# Patient Record
Sex: Female | Born: 1960 | Race: White | Hispanic: No
Health system: Southern US, Community
[De-identification: ages and names within clinical notes are randomized; demographics above are authoritative.]

## PROBLEM LIST (undated history)

## (undated) DIAGNOSIS — E119 Type 2 diabetes mellitus without complications: Secondary | ICD-10-CM

## (undated) DIAGNOSIS — K7460 Unspecified cirrhosis of liver: Secondary | ICD-10-CM

---

## 2017-11-29 ENCOUNTER — Inpatient Hospital Stay (HOSPITAL_COMMUNITY): Payer: Medicare Other

## 2017-11-29 ENCOUNTER — Encounter (HOSPITAL_COMMUNITY): Payer: Self-pay | Admitting: Nurse Practitioner

## 2017-11-29 ENCOUNTER — Inpatient Hospital Stay (HOSPITAL_COMMUNITY)
Admission: AD | Admit: 2017-11-29 | Discharge: 2017-12-03 | DRG: 871 | Disposition: A | Discharge status: Home / Self Care | Payer: Medicare Other | Source: Other Acute Inpatient Hospital | Attending: Pulmonary Disease | Admitting: Pulmonary Disease

## 2017-11-29 DIAGNOSIS — Z7189 Other specified counseling: Secondary | ICD-10-CM | POA: Diagnosis not present

## 2017-11-29 DIAGNOSIS — J9601 Acute respiratory failure with hypoxia: Secondary | ICD-10-CM | POA: Diagnosis present

## 2017-11-29 DIAGNOSIS — N186 End stage renal disease: Secondary | ICD-10-CM | POA: Diagnosis present

## 2017-11-29 DIAGNOSIS — K7200 Acute and subacute hepatic failure without coma: Secondary | ICD-10-CM | POA: Diagnosis present

## 2017-11-29 DIAGNOSIS — Z7401 Bed confinement status: Secondary | ICD-10-CM

## 2017-11-29 DIAGNOSIS — J9690 Respiratory failure, unspecified, unspecified whether with hypoxia or hypercapnia: Secondary | ICD-10-CM | POA: Diagnosis not present

## 2017-11-29 DIAGNOSIS — I4891 Unspecified atrial fibrillation: Secondary | ICD-10-CM | POA: Diagnosis present

## 2017-11-29 DIAGNOSIS — K7581 Nonalcoholic steatohepatitis (NASH): Secondary | ICD-10-CM | POA: Diagnosis present

## 2017-11-29 DIAGNOSIS — G9341 Metabolic encephalopathy: Secondary | ICD-10-CM | POA: Diagnosis present

## 2017-11-29 DIAGNOSIS — E872 Acidosis: Secondary | ICD-10-CM | POA: Diagnosis present

## 2017-11-29 DIAGNOSIS — J189 Pneumonia, unspecified organism: Secondary | ICD-10-CM | POA: Diagnosis present

## 2017-11-29 DIAGNOSIS — L89150 Pressure ulcer of sacral region, unstageable: Secondary | ICD-10-CM | POA: Diagnosis present

## 2017-11-29 DIAGNOSIS — M869 Osteomyelitis, unspecified: Secondary | ICD-10-CM

## 2017-11-29 DIAGNOSIS — L89610 Pressure ulcer of right heel, unstageable: Secondary | ICD-10-CM | POA: Diagnosis present

## 2017-11-29 DIAGNOSIS — E1122 Type 2 diabetes mellitus with diabetic chronic kidney disease: Secondary | ICD-10-CM | POA: Diagnosis present

## 2017-11-29 DIAGNOSIS — R6521 Severe sepsis with septic shock: Secondary | ICD-10-CM | POA: Diagnosis present

## 2017-11-29 DIAGNOSIS — N390 Urinary tract infection, site not specified: Secondary | ICD-10-CM | POA: Diagnosis present

## 2017-11-29 DIAGNOSIS — N179 Acute kidney failure, unspecified: Secondary | ICD-10-CM

## 2017-11-29 DIAGNOSIS — K7290 Hepatic failure, unspecified without coma: Secondary | ICD-10-CM

## 2017-11-29 DIAGNOSIS — Z66 Do not resuscitate: Secondary | ICD-10-CM | POA: Diagnosis present

## 2017-11-29 DIAGNOSIS — A419 Sepsis, unspecified organism: Secondary | ICD-10-CM | POA: Diagnosis present

## 2017-11-29 DIAGNOSIS — R140 Abdominal distension (gaseous): Secondary | ICD-10-CM

## 2017-11-29 DIAGNOSIS — E1169 Type 2 diabetes mellitus with other specified complication: Secondary | ICD-10-CM | POA: Diagnosis present

## 2017-11-29 DIAGNOSIS — Z515 Encounter for palliative care: Secondary | ICD-10-CM | POA: Diagnosis present

## 2017-11-29 DIAGNOSIS — K7460 Unspecified cirrhosis of liver: Secondary | ICD-10-CM | POA: Diagnosis present

## 2017-11-29 DIAGNOSIS — M4628 Osteomyelitis of vertebra, sacral and sacrococcygeal region: Secondary | ICD-10-CM | POA: Diagnosis present

## 2017-11-29 DIAGNOSIS — Z9911 Dependence on respirator [ventilator] status: Secondary | ICD-10-CM

## 2017-11-29 DIAGNOSIS — J90 Pleural effusion, not elsewhere classified: Secondary | ICD-10-CM | POA: Diagnosis present

## 2017-11-29 DIAGNOSIS — L89210 Pressure ulcer of right hip, unstageable: Secondary | ICD-10-CM | POA: Diagnosis present

## 2017-11-29 DIAGNOSIS — E119 Type 2 diabetes mellitus without complications: Secondary | ICD-10-CM

## 2017-11-29 DIAGNOSIS — L8990 Pressure ulcer of unspecified site, unspecified stage: Secondary | ICD-10-CM

## 2017-11-29 HISTORY — DX: Type 2 diabetes mellitus without complications: E11.9

## 2017-11-29 HISTORY — DX: Unspecified cirrhosis of liver: K74.60

## 2017-11-29 LAB — PROTIME-INR
INR: 2.14
Prothrombin Time: 23.8 seconds — ABNORMAL HIGH (ref 11.4–15.2)

## 2017-11-29 LAB — COMPREHENSIVE METABOLIC PANEL
ALT: 11 U/L — ABNORMAL LOW (ref 14–54)
AST: 35 U/L (ref 15–41)
Albumin: 1.8 g/dL — ABNORMAL LOW (ref 3.5–5.0)
Alkaline Phosphatase: 103 U/L (ref 38–126)
Anion gap: 15 (ref 5–15)
BUN: 96 mg/dL — ABNORMAL HIGH (ref 6–20)
CHLORIDE: 101 mmol/L (ref 101–111)
CO2: 20 mmol/L — ABNORMAL LOW (ref 22–32)
CREATININE: 5.79 mg/dL — AB (ref 0.44–1.00)
Calcium: 8.1 mg/dL — ABNORMAL LOW (ref 8.9–10.3)
GFR, EST AFRICAN AMERICAN: 9 mL/min — AB (ref >60)
GFR, EST NON AFRICAN AMERICAN: 7 mL/min — AB (ref >60)
Glucose, Bld: 146 mg/dL — ABNORMAL HIGH (ref 65–99)
POTASSIUM: 3.7 mmol/L (ref 3.5–5.1)
Sodium: 136 mmol/L (ref 135–145)
Total Bilirubin: 3.5 mg/dL — ABNORMAL HIGH (ref 0.3–1.2)
Total Protein: 6.5 g/dL (ref 6.5–8.1)

## 2017-11-29 LAB — GLUCOSE, CAPILLARY: GLUCOSE-CAPILLARY: 124 mg/dL — AB (ref 65–99)

## 2017-11-29 LAB — URINALYSIS, ROUTINE W REFLEX MICROSCOPIC
Bilirubin Urine: NEGATIVE
GLUCOSE, UA: NEGATIVE mg/dL
Ketones, ur: NEGATIVE mg/dL
NITRITE: NEGATIVE
PROTEIN: NEGATIVE mg/dL
Specific Gravity, Urine: 1.01 (ref 1.005–1.030)
pH: 5 (ref 5.0–8.0)

## 2017-11-29 LAB — LACTIC ACID, PLASMA: LACTIC ACID, VENOUS: 3.5 mmol/L — AB (ref 0.5–1.9)

## 2017-11-29 LAB — CBC
HCT: 30.5 % — ABNORMAL LOW (ref 36.0–46.0)
Hemoglobin: 9.9 g/dL — ABNORMAL LOW (ref 12.0–15.0)
MCH: 32.6 pg (ref 26.0–34.0)
MCHC: 32.5 g/dL (ref 30.0–36.0)
MCV: 100.3 fL — ABNORMAL HIGH (ref 78.0–100.0)
Platelets: 221 10*3/uL (ref 150–400)
RBC: 3.04 MIL/uL — ABNORMAL LOW (ref 3.87–5.11)
RDW: 17.5 % — AB (ref 11.5–15.5)
WBC: 25.6 10*3/uL — ABNORMAL HIGH (ref 4.0–10.5)

## 2017-11-29 LAB — BLOOD GAS, ARTERIAL
Acid-base deficit: 3.2 mmol/L — ABNORMAL HIGH (ref 0.0–2.0)
BICARBONATE: 20.2 mmol/L (ref 20.0–28.0)
Drawn by: 414221
FIO2: 40
LHR: 20 {breaths}/min
O2 SAT: 98.2 %
PATIENT TEMPERATURE: 98.6
PCO2 ART: 29.9 mmHg — AB (ref 32.0–48.0)
PEEP: 5 cmH2O
VT: 400 mL
pH, Arterial: 7.444 (ref 7.350–7.450)
pO2, Arterial: 108 mmHg (ref 83.0–108.0)

## 2017-11-29 LAB — CK: Total CK: 110 U/L (ref 38–234)

## 2017-11-29 LAB — MAGNESIUM: MAGNESIUM: 1.7 mg/dL (ref 1.7–2.4)

## 2017-11-29 LAB — AMMONIA: Ammonia: 73 umol/L — ABNORMAL HIGH (ref 9–35)

## 2017-11-29 LAB — PHOSPHORUS: PHOSPHORUS: 6.8 mg/dL — AB (ref 2.5–4.6)

## 2017-11-29 LAB — TROPONIN I: TROPONIN I: 0.11 ng/mL — AB (ref <0.03)

## 2017-11-29 LAB — MRSA PCR SCREENING: MRSA by PCR: POSITIVE — AB

## 2017-11-29 LAB — PROCALCITONIN: PROCALCITONIN: 1.05 ng/mL

## 2017-11-29 LAB — CORTISOL: Cortisol, Plasma: 22.5 ug/dL

## 2017-11-29 LAB — APTT: APTT: 36 s (ref 24–36)

## 2017-11-29 LAB — VANCOMYCIN, RANDOM: Vancomycin Rm: 15

## 2017-11-29 MED ORDER — HEPARIN SODIUM (PORCINE) 5000 UNIT/ML IJ SOLN
5000.0000 [IU] | Freq: Three times a day (TID) | INTRAMUSCULAR | Status: DC
  Administered 2017-11-29 – 2017-12-03 (×11): 5000 [IU] via SUBCUTANEOUS
  Filled 2017-11-29 (×11): qty 1

## 2017-11-29 MED ORDER — INSULIN ASPART 100 UNIT/ML ~~LOC~~ SOLN
0.0000 [IU] | SUBCUTANEOUS | Status: DC
  Administered 2017-11-29: 2 [IU] via SUBCUTANEOUS
  Administered 2017-11-30: 3 [IU] via SUBCUTANEOUS
  Administered 2017-11-30 (×5): 2 [IU] via SUBCUTANEOUS
  Administered 2017-12-01: 3 [IU] via SUBCUTANEOUS
  Administered 2017-12-01: 2 [IU] via SUBCUTANEOUS
  Administered 2017-12-01: 3 [IU] via SUBCUTANEOUS
  Administered 2017-12-02 (×2): 2 [IU] via SUBCUTANEOUS
  Administered 2017-12-02: 3 [IU] via SUBCUTANEOUS
  Administered 2017-12-02: 2 [IU] via SUBCUTANEOUS

## 2017-11-29 MED ORDER — SODIUM CHLORIDE 0.9 % IV SOLN: 250.0000 mL | INTRAVENOUS | Status: DC | PRN

## 2017-11-29 MED ORDER — LACTULOSE 10 GM/15ML PO SOLN
30.0000 g | Freq: Three times a day (TID) | ORAL | Status: DC
  Administered 2017-11-29 – 2017-12-01 (×4): 30 g via ORAL
  Filled 2017-11-29 (×6): qty 45

## 2017-11-29 MED ORDER — DOCUSATE SODIUM 50 MG/5ML PO LIQD
100.0000 mg | Freq: Two times a day (BID) | ORAL | Status: DC
  Administered 2017-11-29 – 2017-12-02 (×6): 100 mg
  Filled 2017-11-29 (×7): qty 10

## 2017-11-29 MED ORDER — VANCOMYCIN HCL IN DEXTROSE 1-5 GM/200ML-% IV SOLN
1000.0000 mg | INTRAVENOUS | Status: DC
Start: 2017-11-29 — End: 2017-12-03
  Administered 2017-11-29 – 2017-12-01 (×2): 1000 mg via INTRAVENOUS
  Filled 2017-11-29 (×2): qty 200

## 2017-11-29 MED ORDER — MIDAZOLAM HCL 2 MG/2ML IJ SOLN: 2.0000 mg | INTRAMUSCULAR | Status: DC | PRN

## 2017-11-29 MED ORDER — SODIUM CHLORIDE 0.9 % IV SOLN
INTRAVENOUS | Status: DC
  Administered 2017-11-29 – 2017-12-03 (×6): via INTRAVENOUS

## 2017-11-29 MED ORDER — PANTOPRAZOLE SODIUM 40 MG PO PACK
40.0000 mg | PACK | Freq: Every day | ORAL | Status: DC
  Administered 2017-11-30 – 2017-12-02 (×3): 40 mg
  Filled 2017-11-29 (×4): qty 20

## 2017-11-29 MED ORDER — MAGNESIUM SULFATE 2 GM/50ML IV SOLN
2.0000 g | Freq: Once | INTRAVENOUS | Status: AC
  Administered 2017-11-29: 2 g via INTRAVENOUS
  Filled 2017-11-29: qty 50

## 2017-11-29 MED ORDER — SODIUM CHLORIDE 0.9 % IV BOLUS
1000.0000 mL | Freq: Once | INTRAVENOUS | Status: AC
  Administered 2017-11-29: 1000 mL via INTRAVENOUS

## 2017-11-29 MED ORDER — NOREPINEPHRINE 4 MG/250ML-% IV SOLN
0.0000 ug/min | INTRAVENOUS | Status: DC
  Administered 2017-11-29: 24 ug/min via INTRAVENOUS
  Administered 2017-11-30: 22 ug/min via INTRAVENOUS
  Administered 2017-11-30: 24 ug/min via INTRAVENOUS
  Administered 2017-11-30: 21 ug/min via INTRAVENOUS
  Administered 2017-11-30: 16 ug/min via INTRAVENOUS
  Administered 2017-11-30: 15 ug/min via INTRAVENOUS
  Administered 2017-12-01: 14 ug/min via INTRAVENOUS
  Administered 2017-12-01: 15 ug/min via INTRAVENOUS
  Administered 2017-12-01: 14 ug/min via INTRAVENOUS
  Administered 2017-12-01 – 2017-12-03 (×9): 15 ug/min via INTRAVENOUS
  Filled 2017-11-29 (×22): qty 250

## 2017-11-29 MED ORDER — MIDAZOLAM HCL 2 MG/2ML IJ SOLN
2.0000 mg | INTRAMUSCULAR | Status: DC | PRN
  Administered 2017-11-29 – 2017-11-30 (×2): 2 mg via INTRAVENOUS
  Filled 2017-11-29 (×2): qty 2

## 2017-11-29 MED ORDER — FENTANYL CITRATE (PF) 100 MCG/2ML IJ SOLN
100.0000 ug | INTRAMUSCULAR | Status: AC | PRN
  Administered 2017-11-29 – 2017-11-30 (×3): 100 ug via INTRAVENOUS
  Filled 2017-11-29 (×3): qty 2

## 2017-11-29 MED ORDER — FENTANYL CITRATE (PF) 100 MCG/2ML IJ SOLN
100.0000 ug | INTRAMUSCULAR | Status: DC | PRN
  Administered 2017-11-29 – 2017-11-30 (×3): 100 ug via INTRAVENOUS
  Filled 2017-11-29 (×3): qty 2

## 2017-11-29 MED ORDER — SODIUM CHLORIDE 0.9 % IV SOLN
500.0000 mg | Freq: Two times a day (BID) | INTRAVENOUS | Status: DC
  Administered 2017-11-30 – 2017-12-02 (×7): 500 mg via INTRAVENOUS
  Filled 2017-11-29 (×8): qty 0.5

## 2017-11-29 NOTE — Progress Notes (Signed)
CRITICAL VALUE ALERT  Critical Value:  Troponin 0.11, lactic acid 3.5  Date & Time Notied:  11/29/2017 2150  Provider Notified: Idolina PrimerK Eubanks NP  Orders Received/Actions taken: no new orders at this time

## 2017-11-29 NOTE — Progress Notes (Addendum)
Pharmacy Antibiotic Note  Anna Miranda is a 57 y.o. female admitted transferred from OSH on 11/29/2017 with sepsis.  At [facility] hospital ED: CT Chest concerning for necrotic lung from prior infection. U/A with bacteria and Leukocytes. Multiple wounds that are foul smelling and maggots were noted  - Pharmacy has been consulted for Vancomycin and Meropenem dosing.  Vanc/meropenem D#1 for sepsis - WBC 28.3, SCr 5.91 per OSH (unknown baseline) - received vanc 1g x 1 6/15 @1600  + meropenem 500mg  Q12H at 1300 today  PMH of End Stage Liver Disease, DM, Recurrent Right Side Pleural Effusion   Plan: Vancomycin random level now, then access when next dose of vancomycin needed. F/u  SCr ,then order maintenance Meropenem and vancomycin dose.  Follow up clinical status, renal function, culture results, and check vancomycin level per protocol.  Height: 5\' 9"  (175.3 cm) Weight: 189 lb 2.5 oz (85.8 kg) IBW/kg (Calculated) : 66.2  No data recorded.  No results for input(s): WBC, CREATININE, LATICACIDVEN, VANCOTROUGH, VANCOPEAK, VANCORANDOM, GENTTROUGH, GENTPEAK, GENTRANDOM, TOBRATROUGH, TOBRAPEAK, TOBRARND, AMIKACINPEAK, AMIKACINTROU, AMIKACIN in the last 168 hours.  CrCl cannot be calculated (No order found.).    Allergies not on file  Antimicrobials this admission: Vanc 6/15>> Meropenem 6/16>> Zosyn x 1 6/15 Azithro x 1 6/15   Dose adjustments this admission:   Microbiology results: 6/16 Resp cx: sent 6/16 Bld x2: sent  6/16 MRSA PCR sent  Thank you for allowing pharmacy to be a part of this patient's care.  Anna Miranda, RPh Clinical Pharmacist 7:30-3:30pm: [phone] 3:30-10:00pm [phone] Main Rx: [id number] 11/29/2017 8:50 PM   ADDENDUM:  Vancomycin random - 15 mcg/ml  ( after 1 g IV x1 given 6/15 @ 16:00 at OSH) SCr 5.79, crcl ~ 12 ml/min WBC 25.6k , LA 3.5, PCT 1.05 Wt 85.8 kg  Plan:  -Meropenem 500mg  q12h  Vancomycin 1000mg  IV q48h  Anna Miranda, RPh Clinical  Pharmacist [phone] Main Pharmacy 11/29/2017 10:18 PM

## 2017-11-29 NOTE — Progress Notes (Signed)
PCCM Interval Note   Spoke to son Iantha FallenKenneth. States that mother was completely unresponsive 6/15 when he arrived to her house so he called EMS. When he arrived to the hospital patient was intubated. He states that patient had a DNR and never wanted to be intubated, however since she was already intubated he wanted to continue treatment at this time.   6-8 months ago patient had a fall resulting in a leg fracture, since her mobility and health have declined. She is basically bed bound at this point however is resistant to help with ADLS. One month ago patient was at [facility] with recurrent pleural effusion secondary to liver cirrhosis and required a chest tube.  His goal is to give her 48-72 to see if she improves, during this time if she declines he understands that her wishes would be not to undergo aggressive care.   Jovita KussmaulKatalina Eubanks, AGACNP-BC [facility]  Pgr: [phone]  PCCM Pgr: [phone]

## 2017-11-29 NOTE — H&P (Addendum)
PULMONARY / CRITICAL CARE MEDICINE   Name: Anna Miranda MRN: [id number] DOB: [birth year]    ADMISSION DATE:  11/29/2017 CONSULTATION DATE:  11/29/2017  REFERRING MD:  [facility]   CHIEF COMPLAINT:  Sepsis   HISTORY OF PRESENT ILLNESS:   57 year old female with PMH of End Stage Liver Disease, DM, Recurrent Right Side Pleural Effusion  Presents to [place] on 6/15 with reported 2 weeks of poor appetite and generalized weakness. Family reports that patient has been resistant to care and unwilling to go to the hospital. When she arrived to ED her BP was 60/40. Received Fluid, Vancomycin, Meropenem. CT Chest concerning for necrotic lung from prior infection. U/A with bacteria and Leukocytes. Multiple wounds that are foul smelling and maggots were noted. Required intubation for respiratory distress and hypoxia,however documented in discharge note from [facility] that patient and family have previously expressed wishes for no intubation. On 6/16 presents to [facility] on Levophed.  Recent admission one month ago to [place], no much information available, documented however that she had a chest tube placed for recurrent pleural effusion.     Documented DNR, in which family states patient signed 2 years ago and would like to uphold.   PAST MEDICAL HISTORY :  She  has a past medical history of Cirrhosis (HCC) and Diabetes mellitus without complication (HCC).  PAST SURGICAL HISTORY: She  has no past surgical history on file.  Allergies not on file  No current facility-administered medications on file prior to encounter.    No current outpatient medications on file prior to encounter.    FAMILY HISTORY:  Her has no family status information on file.    SOCIAL HISTORY: She    REVIEW OF SYSTEMS:   Unable to review as patient is intubated   SUBJECTIVE:    VITAL SIGNS: BP (!) 114/99   Pulse (!) 104   Resp (!) 21   Ht 5\' 9"  (1.753 m)   Wt 85.8 kg (189 lb 2.5 oz)   SpO2 100%    BMI 27.93 kg/m   HEMODYNAMICS:    VENTILATOR SETTINGS:    INTAKE / OUTPUT: No intake/output data recorded.  PHYSICAL EXAMINATION: General:  Adult female, no distress  Neuro:  Lethargic, opens eyes to verbal stimulation, nods head, does not move extremities, pupils intact   HEENT:  Dry MM Cardiovascular:  Tachy, no MRG  Lungs:  Clear breath sounds  Abdomen:  Non-distended, soft  Musculoskeletal: chronic vascular insuffiencey  Skin: multiple pressures ulcers as described below   LABS:  BMET No results for input(s): NA, K, CL, CO2, BUN, CREATININE, GLUCOSE in the last 168 hours.  Electrolytes No results for input(s): CALCIUM, MG, PHOS in the last 168 hours.  CBC Recent Labs  Lab 11/29/17 2030  WBC 25.6*  HGB 9.9*  HCT 30.5*  PLT 221    Coag's Recent Labs  Lab 11/29/17 2030  APTT 36  INR 2.14    Sepsis Markers No results for input(s): LATICACIDVEN, PROCALCITON, O2SATVEN in the last 168 hours.  ABG Recent Labs  Lab 11/29/17 2010  PHART 7.444  PCO2ART 29.9*  PO2ART 108    Liver Enzymes No results for input(s): AST, ALT, ALKPHOS, BILITOT, ALBUMIN in the last 168 hours.  Cardiac Enzymes No results for input(s): TROPONINI, PROBNP in the last 168 hours.  Glucose Recent Labs  Lab 11/29/17 2000  GLUCAP 124*    Imaging Dg Chest Port 1 View  Result Date: 11/29/2017 CLINICAL DATA:  Vent dependent; verify  ETT and OG if possible EXAM: PORTABLE CHEST 1 VIEW COMPARISON:  None. FINDINGS: Endotracheal tube appears well positioned with tip approximately 4 cm above the carina. Enteric tube passes below the diaphragm. LEFT IJ central line appears adequately positioned with tip at the level of the upper SVC. Probable atelectasis and/or small pleural effusion at the RIGHT lung base. No pneumothorax seen. Heart size is upper normal. IMPRESSION: 1. Endotracheal tube appears well positioned with tip approximately 4 cm above the carina. 2. LEFT IJ central line in place  with tip at the level of the upper SVC. 3. Enteric tube passes below the diaphragm. 4. Probable atelectasis and/or small pleural effusion at the RIGHT lung base. Additional chronic/postsurgical changes at the RIGHT lung base? Electronically Signed   By: Bary RichardStan  Maynard M.D.   On: 11/29/2017 19:58     STUDIES:  CT C/A/P 6/15 > Appearance of the right hemithorax which favors sequelae of prior infection, with chronic pleural fluid and right lung base volume loss/rounded atelectasis. Areas of other right-sided opacification and anterior lung destruction. Fluid secretions in the Endobronchial tree, nonspecific left sided thyroid nodule   CULTURES: Blood 6/16 >> Sputum 6/16 >> U/A 6/16 >>  ANTIBIOTICS: Vancomycin 6/15 >> Meropenem 6/16 >>  SIGNIFICANT EVENTS: 6/15 > Presents to ED  6/16 > Transferred to [facility]   LINES/TUBES: ETT 6/15 >>> Left IJ CVC 6/15 >>   DISCUSSION: 57 year old female with PMH of Liver Cirrhosis presents to [facility] ED with septic shock on 6/15. Transferred to [facility] on 6/16 for further care.    ASSESSMENT / PLAN:  PULMONARY A: Acute Hypoxic Respiratory Failure with  Recurrent Right Side Pleural Effusion P:   Vent Support Trend ABG/CXR VAP Bundle  Pulmonary Hygiene   CARDIOVASCULAR A:  Hypotension in setting of Septic Shock  H/O A.Fib  P:  Cardiac Monitoring  Wean Levophed to Maintain MAP >65  ECHO pending  Trend CVP   RENAL A:   Acute Kidney Injury  (Unsure Baseline Function) > Currently Crt 5.91 Anion Gap Metabolic Acidosis with Lactic Acidosis  P:   Trend BMP Replace electrolytes as indicated  Trend LA  Trend CK  Renal U/S pending   GASTROINTESTINAL A:   End-Stage Liver Cirrhosis   -Ammonia 115  P:   NPO PPI Trend LFT Lactulose 30 mg TID   HEMATOLOGIC A:   Concern for affected coagulation given Liver Cirrhosis  P:  Trend CBC INR/PT pending  Heparin SQ for VTE   INFECTIOUS A:   Septic Shock in setting of Osteo vs  Urosepsis -WBC 25.6 >>  Sacral wound with exudate Right Great Toe with redness and eschar  Right Heel Stage 2  P:   Trend WBC and Fever Curve Trend PCT and LA  PAN Culture Continue Vancomycin and Meropenem  Xray of Right Foot pending given concern for Osteo   ENDOCRINE A:   DM   P:   Trend Glucose SSI   NEUROLOGIC A:   Hepatic vs Metabolic Encephalopathy   -Ammonia 115  P:   RASS goal: 0/-1 Fentanyl and Versed PRN to achieve RASS  Ammonia Pending    FAMILY  - Updates: Son updated via phone, states that mother is a known DNR and has expressed these wishes for the last 2 years. Currently would like to gather more information as far as prognosis and diagnosis before he makes further decisions   - Inter-disciplinary family meet or Palliative Care meeting due by:  12/06/2017  CC  Time: 62 minutes   Jovita Kussmaul, AGACNP-BC [facility]  Pgr: [phone]  PCCM Pgr: [phone]

## 2017-11-30 ENCOUNTER — Inpatient Hospital Stay (HOSPITAL_COMMUNITY): Payer: Medicare Other

## 2017-11-30 DIAGNOSIS — K7460 Unspecified cirrhosis of liver: Secondary | ICD-10-CM

## 2017-11-30 DIAGNOSIS — J9601 Acute respiratory failure with hypoxia: Secondary | ICD-10-CM

## 2017-11-30 DIAGNOSIS — L8990 Pressure ulcer of unspecified site, unspecified stage: Secondary | ICD-10-CM

## 2017-11-30 LAB — GLUCOSE, CAPILLARY
GLUCOSE-CAPILLARY: 121 mg/dL — AB (ref 65–99)
GLUCOSE-CAPILLARY: 133 mg/dL — AB (ref 65–99)
GLUCOSE-CAPILLARY: 140 mg/dL — AB (ref 65–99)
Glucose-Capillary: 153 mg/dL — ABNORMAL HIGH (ref 65–99)
Glucose-Capillary: 150 mg/dL — ABNORMAL HIGH (ref 65–99)
Glucose-Capillary: 129 mg/dL — ABNORMAL HIGH (ref 65–99)
Glucose-Capillary: 111 mg/dL — ABNORMAL HIGH (ref 65–99)

## 2017-11-30 LAB — CBC
HCT: 29.8 % — ABNORMAL LOW (ref 36.0–46.0)
Hemoglobin: 9.7 g/dL — ABNORMAL LOW (ref 12.0–15.0)
MCH: 33.3 pg (ref 26.0–34.0)
MCHC: 32.6 g/dL (ref 30.0–36.0)
MCV: 102.4 fL — ABNORMAL HIGH (ref 78.0–100.0)
PLATELETS: 198 10*3/uL (ref 150–400)
RBC: 2.91 MIL/uL — AB (ref 3.87–5.11)
RDW: 17.3 % — ABNORMAL HIGH (ref 11.5–15.5)
WBC: 31.1 10*3/uL — ABNORMAL HIGH (ref 4.0–10.5)

## 2017-11-30 LAB — BLOOD GAS, ARTERIAL
ACID-BASE DEFICIT: 3.8 mmol/L — AB (ref 0.0–2.0)
Bicarbonate: 20.8 mmol/L (ref 20.0–28.0)
DRAWN BY: 10006
FIO2: 40
MECHVT: 400 mL
O2 Saturation: 98.8 %
PEEP/CPAP: 5 cmH2O
PO2 ART: 139 mmHg — AB (ref 83.0–108.0)
Patient temperature: 98.6
RATE: 20 resp/min
pCO2 arterial: 37.7 mmHg (ref 32.0–48.0)
pH, Arterial: 7.36 (ref 7.350–7.450)

## 2017-11-30 LAB — BASIC METABOLIC PANEL
Anion gap: 11 (ref 5–15)
BUN: 90 mg/dL — ABNORMAL HIGH (ref 6–20)
CALCIUM: 7.9 mg/dL — AB (ref 8.9–10.3)
CO2: 21 mmol/L — AB (ref 22–32)
CREATININE: 5.45 mg/dL — AB (ref 0.44–1.00)
Chloride: 104 mmol/L (ref 101–111)
GFR calc non Af Amer: 8 mL/min — ABNORMAL LOW (ref >60)
GFR, EST AFRICAN AMERICAN: 9 mL/min — AB (ref >60)
Glucose, Bld: 185 mg/dL — ABNORMAL HIGH (ref 65–99)
Potassium: 3.5 mmol/L (ref 3.5–5.1)
SODIUM: 136 mmol/L (ref 135–145)

## 2017-11-30 LAB — AMMONIA: Ammonia: 61 umol/L — ABNORMAL HIGH (ref 9–35)

## 2017-11-30 LAB — ECHOCARDIOGRAM COMPLETE
HEIGHTINCHES: 69 in
WEIGHTICAEL: 3026.47 [oz_av]

## 2017-11-30 LAB — PHOSPHORUS: Phosphorus: 6.2 mg/dL — ABNORMAL HIGH (ref 2.5–4.6)

## 2017-11-30 LAB — MAGNESIUM: MAGNESIUM: 2.1 mg/dL (ref 1.7–2.4)

## 2017-11-30 LAB — HIV ANTIBODY (ROUTINE TESTING W REFLEX): HIV Screen 4th Generation wRfx: NONREACTIVE

## 2017-11-30 LAB — LACTIC ACID, PLASMA
LACTIC ACID, VENOUS: 2.5 mmol/L — AB (ref 0.5–1.9)
LACTIC ACID, VENOUS: 2.3 mmol/L — AB (ref 0.5–1.9)
LACTIC ACID, VENOUS: 2.1 mmol/L — AB (ref 0.5–1.9)

## 2017-11-30 LAB — TROPONIN I
Troponin I: 0.06 ng/mL (ref <0.03)
Troponin I: 0.05 ng/mL (ref <0.03)
Troponin I: 0.04 ng/mL (ref <0.03)

## 2017-11-30 LAB — PROCALCITONIN: Procalcitonin: 1.31 ng/mL

## 2017-11-30 MED ORDER — ORAL CARE MOUTH RINSE
15.0000 mL | OROMUCOSAL | Status: DC
  Administered 2017-11-30 – 2017-12-03 (×30): 15 mL via OROMUCOSAL

## 2017-11-30 MED ORDER — SODIUM CHLORIDE 0.9% FLUSH: 10.0000 mL | INTRAVENOUS | Status: DC | PRN

## 2017-11-30 MED ORDER — CHLORHEXIDINE GLUCONATE CLOTH 2 % EX PADS
6.0000 | MEDICATED_PAD | Freq: Every day | CUTANEOUS | Status: DC
  Administered 2017-11-30 – 2017-12-02 (×3): 6 via TOPICAL

## 2017-11-30 MED ORDER — FENTANYL BOLUS VIA INFUSION
50.0000 ug | INTRAVENOUS | Status: DC | PRN
  Filled 2017-11-30: qty 50

## 2017-11-30 MED ORDER — SODIUM CHLORIDE 0.9 % IV BOLUS
1000.0000 mL | Freq: Once | INTRAVENOUS | Status: AC
  Administered 2017-11-30: 1000 mL via INTRAVENOUS

## 2017-11-30 MED ORDER — FENTANYL 2500MCG IN NS 250ML (10MCG/ML) PREMIX INFUSION
25.0000 ug/h | INTRAVENOUS | Status: DC
  Administered 2017-11-30: 50 ug/h via INTRAVENOUS
  Administered 2017-12-01 – 2017-12-02 (×2): 100 ug/h via INTRAVENOUS
  Administered 2017-12-03: 150 ug/h via INTRAVENOUS
  Filled 2017-11-30 (×4): qty 250

## 2017-11-30 MED ORDER — SODIUM CHLORIDE 0.9% FLUSH
10.0000 mL | Freq: Two times a day (BID) | INTRAVENOUS | Status: DC
  Administered 2017-11-30 (×2): 10 mL

## 2017-11-30 MED ORDER — MUPIROCIN 2 % EX OINT
1.0000 "application " | TOPICAL_OINTMENT | Freq: Two times a day (BID) | CUTANEOUS | Status: DC
  Administered 2017-11-30 – 2017-12-02 (×7): 1 via NASAL
  Filled 2017-11-30 (×2): qty 22

## 2017-11-30 MED ORDER — CHLORHEXIDINE GLUCONATE 0.12% ORAL RINSE (MEDLINE KIT)
15.0000 mL | Freq: Two times a day (BID) | OROMUCOSAL | Status: DC
  Administered 2017-11-30 – 2017-12-03 (×7): 15 mL via OROMUCOSAL

## 2017-11-30 MED ORDER — COLLAGENASE 250 UNIT/GM EX OINT
TOPICAL_OINTMENT | Freq: Every day | CUTANEOUS | Status: DC
  Administered 2017-12-01 – 2017-12-02 (×3): via TOPICAL
  Filled 2017-11-30 (×3): qty 30

## 2017-11-30 MED ORDER — VASOPRESSIN 20 UNIT/ML IV SOLN
0.0300 [IU]/min | INTRAVENOUS | Status: DC
  Administered 2017-11-30 – 2017-12-03 (×4): 0.03 [IU]/min via INTRAVENOUS
  Filled 2017-11-30 (×3): qty 2

## 2017-11-30 MED ORDER — ALBUMIN HUMAN 5 % IV SOLN
12.5000 g | Freq: Once | INTRAVENOUS | Status: AC
  Administered 2017-11-30: 12.5 g via INTRAVENOUS
  Filled 2017-11-30: qty 250

## 2017-11-30 MED ORDER — FENTANYL CITRATE (PF) 100 MCG/2ML IJ SOLN
50.0000 ug | Freq: Once | INTRAMUSCULAR | Status: AC
  Administered 2017-11-30: 50 ug via INTRAVENOUS
  Filled 2017-11-30: qty 2

## 2017-11-30 MED ORDER — CHLORHEXIDINE GLUCONATE CLOTH 2 % EX PADS: 6.0000 | MEDICATED_PAD | Freq: Every day | CUTANEOUS | Status: DC

## 2017-11-30 MED ORDER — ALBUMIN HUMAN 25 % IV SOLN
50.0000 g | Freq: Once | INTRAVENOUS | Status: AC
  Administered 2017-11-30: 50 g via INTRAVENOUS
  Filled 2017-11-30: qty 200
  Filled 2017-11-30: qty 250

## 2017-11-30 NOTE — Progress Notes (Signed)
Echocardiogram 2D Echocardiogram has been performed.  Pieter PartridgeBrooke S Bertil Brickey 11/30/2017, 8:38 AM

## 2017-11-30 NOTE — Progress Notes (Signed)
Patient's CVP running a little low at 5-7; will give 1L NS bolus.  Has has no ABG today; obtain one now.   Discussed with bedside RN

## 2017-11-30 NOTE — Consult Note (Signed)
Consultation Note Date: 11/30/2017   Patient Name: Anna Miranda  DOB: 05-Sep-1960  MRN: [id number]  Age / Sex: 57 y.o., female  PCP: No primary care provider on file. Referring Physician: Reyne Dumas, MD  Reason for Consultation: Establishing goals of care  HPI/Patient Profile: 57 y.o. female admitted on 11/29/2017    Clinical Assessment and Goals of Care:  57 year old lady with history of end-stage liver disease diabetes recurrent right-sided pleural effusion, presented to outside hospital with 2 weeks worth of poor appetite and generalized weakness. Noted to have low blood pressures noted to have CT chest concerning for necrotic lung from prior infection, noted to have possible urinary tract infection multiple foul-smelling wounds. Was intubated for respiratory distress at outside hospital and was transferred to [facility] on pressors.  Patient remains intubated mechanically ventilated on pressors and on fentanyl low-dose infusion here. Concern for septic shock due to urinary infection and wounds. To undergo MRI of the right hip for further workup of wounds. She has been seen by wound care. She is on broad-spectrum antibiotics.  Palliative consultation for goals of care discussions.  Patient is resting in bed. She does not appear to be in any acute distress. Discussed in detail with bedside RN, appreciate information and input. It is noted that the patient was very restless a few hours ago. She appears to be resting comfortably since initiation of low-dose fentanyl infusion. There is no family present at the bedside.  Call placed and discussed with son Chrissie Noa at [phone]. I introduce myself and palliative care as follows: Palliative medicine is specialized medical care for people living with serious illness. It focuses on providing relief from the symptoms and stress of a serious illness. The goal  is to improve quality of life for both the patient and the family.  Discussed patient's current conditions. Son had called the ICU a few hours earlier and got information about how his mother is doing. He asks, "is she getting any better?"  Discussed frankly and compassionately with the patient's son about the serious and extensive nature of the patient's illness. Her baseline is such that she was not able to walk much. She has been having generalized weakness for several months. Son states that she was living with her boyfriend and was not taking good care of herself. Subsequently, a few months ago, the son brought the patient to live with him in [place], [place].   Goals wishes and values discussed. Current care being provided in the ICU also discussed. Discussed frankly that the patient is requiring a lot of mechanical, external support with both ventilator and pressors at the moment.  See additional discussions/recommendations below. Thank you for the consult.  NEXT OF KIN  son Geraldin Habermehl [phone]. He states he is the patient's only child. He states the patient is not married.  Additional discussions/SUMMARY OF RECOMMENDATIONS    1. Agree with Partial code.   2. Continue current mode of care as a time-limited trial for the next 24-48 hours. Patient remains on  broad-spectrum antibiotics, pressors, intubation and mechanical ventilation.   All of this has been discussed with the son Oriah Leinweber over the phone at [phone].  Further imaging with MRI to further ascertain extent of wounds.   Discussed with patient's son about considering compassionate extubation/focusing on comfort measures  should the patient not have indications of significant meaningful recovery within the next 1-2 days. He is in agreement.   Palliative medicine team to continue to follow along, follow hospital course and help guide decision-making.  Thank you for the consult.   Code Status/Advance Care  Planning:  Limited code    Symptom Management:    as above, is on fentanyl low-dose infusion.  Palliative Prophylaxis:   Delirium Protocol    Psycho-social/Spiritual:   Desire for further Chaplaincy support:yes  Additional Recommendations: Caregiving  Support/Resources  Prognosis:   Unable to determine  Discharge Planning: To Be Determined      Primary Diagnoses: Present on Admission: . Sepsis ([place])   I have reviewed the medical record, interviewed the patient and family, and examined the patient. The following aspects are pertinent.  Past Medical History:  Diagnosis Date  . Cirrhosis ([place])   . Diabetes mellitus without complication [facility])    Social History   Socioeconomic History  . Marital status: Divorced    Spouse name: Not on file  . Number of children: Not on file  . Years of education: Not on file  . Highest education level: Not on file  Occupational History  . Not on file  Social Needs  . Financial resource strain: Not on file  . Food insecurity:    Worry: Not on file    Inability: Not on file  . Transportation needs:    Medical: Not on file    Non-medical: Not on file  Tobacco Use  . Smoking status: Not on file  Substance and Sexual Activity  . Alcohol use: Not on file  . Drug use: Not on file  . Sexual activity: Not on file  Lifestyle  . Physical activity:    Days per week: Not on file    Minutes per session: Not on file  . Stress: Not on file  Relationships  . Social connections:    Talks on phone: Not on file    Gets together: Not on file    Attends religious service: Not on file    Active member of club or organization: Not on file    Attends meetings of clubs or organizations: Not on file    Relationship status: Not on file  Other Topics Concern  . Not on file  Social History Narrative  . Not on file   No family history on file. Scheduled Meds: . chlorhexidine gluconate (MEDLINE KIT)  15 mL Mouth Rinse BID  .  Chlorhexidine Gluconate Cloth  6 each Topical Daily  . collagenase   Topical Daily  . docusate  100 mg Per Tube BID  . heparin  5,000 Units Subcutaneous Q8H  . insulin aspart  0-15 Units Subcutaneous Q4H  . lactulose  30 g Oral TID  . mouth rinse  15 mL Mouth Rinse 10 times per day  . mupirocin ointment  1 application Nasal BID  . pantoprazole sodium  40 mg Per Tube Daily  . sodium chloride flush  10-40 mL Intracatheter Q12H   Continuous Infusions: . sodium chloride    . sodium chloride 75 mL/hr at 11/30/17 0600  . fentaNYL infusion INTRAVENOUS 50 mcg/hr (11/30/17 1032)  .  meropenem (MERREM) IV Stopped (11/30/17 1137)  . norepinephrine (LEVOPHED) Adult infusion 23 mcg/min (11/30/17 1209)  . vancomycin Stopped (11/30/17 0020)   PRN Meds:.sodium chloride, fentaNYL, midazolam, midazolam, sodium chloride flush Medications Prior to Admission:  Prior to Admission medications   Not on File   No Known Allergies Review of Systems Sedated on vent Non verbal  Physical Exam Weak appearing lady resting in bed Sedated intubated and mechanically ventilated Is on fentanyl Is on pressors Does not open eyes does not follow commands Regular mechanical breath sounds Abdomen is not distended Both heels/lower extremities are in heel pads, patient is noted to have multiple pressure ulcers. Wound care note reviewed in detail  Vital Signs: BP (!) 114/49   Pulse 66   Temp 97.7 F (36.5 C) (Oral)   Resp (!) 28   Ht _0  (1.753 m)   Wt 85.8 kg (189 lb 2.5 oz)   SpO2 100%   BMI 27.93 kg/m  Pain Scale: CPOT       SpO2: SpO2: 100 % O2 Device:SpO2: 100 % O2 Flow Rate: .   IO: Intake/output summary:   Intake/Output Summary (Last 24 hours) at 11/30/2017 1307 Last data filed at 11/30/2017 1200 Gross per 24 hour  Intake 3649.13 ml  Output 1050 ml  Net 2599.13 ml    LBM:   Baseline Weight: Weight: 85.8 kg (189 lb 2.5 oz) Most recent weight: Weight: 85.8 kg (189 lb 2.5 oz)       Palliative Assessment/Data:   PPS 10%  Time In:  12 Time Out:  1300 Time Total:  60 min  Greater than 50%  of this time was spent counseling and coordinating care related to the above assessment and plan.  Signed by: Loistine Chance, MD  [phone]  Please contact Palliative Medicine Team phone at [phone] for questions and concerns.  For individual provider: See Shea Evans

## 2017-11-30 NOTE — Progress Notes (Signed)
PULMONARY / CRITICAL CARE MEDICINE   Name: Takiya Belmares MRN: [id number] DOB: 01-Feb-1961    ADMISSION DATE:  11/29/2017 CONSULTATION DATE:  11/29/2017  REFERRING MD:  [facility]   CHIEF COMPLAINT:  Sepsis   HISTORY OF PRESENT ILLNESS:   57 year old female with PMH of End Stage Liver Disease, DM, Recurrent Right Side Pleural Effusion  Presents to [facility] on 6/15 with reported 2 weeks of poor appetite and generalized weakness. Family reports that patient has been resistant to care and unwilling to go to the hospital. When she arrived to ED her BP was 60/40. Received Fluid, Vancomycin, Meropenem. CT Chest concerning for necrotic lung from prior infection. U/A with bacteria and Leukocytes. Multiple wounds that are foul smelling and maggots were noted. Required intubation for respiratory distress and hypoxia,however documented in discharge note from [facility] that patient and family have previously expressed wishes for no intubation. On 6/16 presents to [facility] on Levophed.  Recent admission one month ago to [place], no much information available, documented however that she had a chest tube placed for recurrent pleural effusion.     Documented DNR, in which family states patient signed 2 years ago and would like to uphold.   SUBJECTIVE:    VITAL SIGNS: BP 127/62   Pulse 72   Temp 98.3 F (36.8 C) (Oral)   Resp (!) 25   Ht 5\' 9"  (1.753 m)   Wt 85.8 kg (189 lb 2.5 oz)   SpO2 100%   BMI 27.93 kg/m   HEMODYNAMICS: CVP:  [6 mmHg-7 mmHg] 6 mmHg  VENTILATOR SETTINGS: Vent Mode: PRVC FiO2 (%):  [40 %] 40 % Set Rate:  [20 bmp] 20 bmp Vt Set:  [400 mL] 400 mL PEEP:  [5 cmH20] 5 cmH20 Plateau Pressure:  [10 cmH20-15 cmH20] 10 cmH20  INTAKE / OUTPUT: I/O last 3 completed shifts: In: 3449.1 [I.V.:1249.1; IV Piggyback:2200] Out: 550 [Urine:550]  PHYSICAL EXAMINATION: General:  Adult female, no distress  Neuro:  Lethargic, opens eyes to verbal stimulation, nods head, does not  move extremities, pupils intact   HEENT:  Dry MM Cardiovascular:  Tachy, no MRG  Lungs:  Clear breath sounds  Abdomen:  Non-distended, soft  Musculoskeletal: chronic vascular insuffiencey  Skin: multiple pressures ulcers as described below   LABS:  BMET Recent Labs  Lab 11/29/17 2030 11/30/17 0448  NA 136 136  K 3.7 3.5  CL 101 104  CO2 20* 21*  BUN 96* 90*  CREATININE 5.79* 5.45*  GLUCOSE 146* 185*    Electrolytes Recent Labs  Lab 11/29/17 2030 11/30/17 0448  CALCIUM 8.1* 7.9*  MG 1.7 2.1  PHOS 6.8* 6.2*    CBC Recent Labs  Lab 11/29/17 2030 11/30/17 0448  WBC 25.6* 31.1*  HGB 9.9* 9.7*  HCT 30.5* 29.8*  PLT 221 198    Coag's Recent Labs  Lab 11/29/17 2030  APTT 36  INR 2.14    Sepsis Markers Recent Labs  Lab 11/29/17 2030 11/30/17 0448 11/30/17 0504  LATICACIDVEN 3.5*  --  2.5*  PROCALCITON 1.05 1.31  --     ABG Recent Labs  Lab 11/29/17 2010  PHART 7.444  PCO2ART 29.9*  PO2ART 108    Liver Enzymes Recent Labs  Lab 11/29/17 2030  AST 35  ALT 11*  ALKPHOS 103  BILITOT 3.5*  ALBUMIN 1.8*    Cardiac Enzymes Recent Labs  Lab 11/29/17 2030 11/30/17 0448  TROPONINI 0.11* 0.06*    Glucose Recent Labs  Lab 11/29/17 2000  11/29/17 2356 11/30/17 0348 11/30/17 0728  GLUCAP 124* 140* 153* 133*    Imaging [place] Renal  Result Date: 11/29/2017 CLINICAL DATA:  Acute renal injury EXAM: RENAL / URINARY TRACT ULTRASOUND COMPLETE COMPARISON:  None. FINDINGS: Exam limited.  Immobile patient. Right Kidney: Length: 9.5 cm.  No hydronephrosis. Left Kidney: Length: 8.3 cm.  No hydronephrosis Bladder: decompressed IMPRESSION: No hydronephrosis. Electronically Signed   By: Genevive BiStewart  Edmunds M.D.   On: 11/29/2017 22:33   Dg Chest Port 1 View  Result Date: 11/29/2017 CLINICAL DATA:  Vent dependent; verify ETT and OG if possible EXAM: PORTABLE CHEST 1 VIEW COMPARISON:  None. FINDINGS: Endotracheal tube appears well positioned with tip  approximately 4 cm above the carina. Enteric tube passes below the diaphragm. LEFT IJ central line appears adequately positioned with tip at the level of the upper SVC. Probable atelectasis and/or small pleural effusion at the RIGHT lung base. No pneumothorax seen. Heart size is upper normal. IMPRESSION: 1. Endotracheal tube appears well positioned with tip approximately 4 cm above the carina. 2. LEFT IJ central line in place with tip at the level of the upper SVC. 3. Enteric tube passes below the diaphragm. 4. Probable atelectasis and/or small pleural effusion at the RIGHT lung base. Additional chronic/postsurgical changes at the RIGHT lung base? Electronically Signed   By: Bary RichardStan  Maynard M.D.   On: 11/29/2017 19:58   Dg Ankle Right Port  Result Date: 11/29/2017 CLINICAL DATA:  Evaluate osteomyelitis RIGHT ankle. Deep tissue injury RIGHT os calcis EXAM: PORTABLE RIGHT ANKLE - 2 VIEW COMPARISON:  None. FINDINGS: There is no cortical disruption or obvious destructive change to suggest active osteomyelitis within the osseous structures about the RIGHT ankle. However, patchy osteopenia limits characterization and osteomyelitis cannot be confidently excluded. Severe distortion and fragmentation within the hindfoot and midfoot suggest neuropathic joint. IMPRESSION: 1. No definite evidence of osteomyelitis involving the osseous structures about the RIGHT ankle. 2. However, patchy osteopenia limits characterization making it difficult to confidently exclude osteomyelitis. If clinical suspicion for osteomyelitis persists, would consider MRI for more definitive characterization. 3. Distortion/fragmentation within the midfoot and hindfoot suggest neuropathic joint. Electronically Signed   By: Bary RichardStan  Maynard M.D.   On: 11/29/2017 21:28   Dg Foot 2 Views Right  Result Date: 11/29/2017 CLINICAL DATA:  Evaluate for osteomyelitis RIGHT great toe, wound on tip of distal phalanx of great toe. Also deep tissue injury to  calcaneus of RIGHT foot. EXAM: RIGHT FOOT - 2 VIEW COMPARISON:  None. FINDINGS: Patchy osteopenia limits characterization for osteomyelitis. There is no gross destruction or cortical disruption to confirm an active osteomyelitis. Specifically, no destruction or deformity of the distal phalanx of the great toe. Severe degenerative change and/or fragmentation within the midfoot suggest neuropathic joint. Large dystrophic calcifications are seen along the dorsal and plantar margins of the posterior calcaneus. No soft tissue gas identified. Irregularity of the soft tissues overlying the tuft of the distal phalanx of the great toe is compatible with the given history of soft tissue wound. IMPRESSION: 1. No definite evidence of osteomyelitis, with limitations detailed above. Specifically, no destructive change or cortical disruption at the distal phalanx of the great toe corresponding to the area of most clinical concern. 2. Irregularity of the soft tissues overlying the tuft of the distal phalanx of the great toe, compatible with the given history of soft tissue wound. No soft tissue gas seen. 3. Marked deformity/fragmentation within the midfoot suggest neuropathic joint. 4. Large dystrophic calcifications along the plantar and  dorsal margins of the posterior calcaneus, suggesting chronic severe plantar fasciitis and/or Achilles tendinosis. Electronically Signed   By: Bary Richard M.D.   On: 11/29/2017 21:32     STUDIES:  CT C/A/P 6/15 > Appearance of the right hemithorax which favors sequelae of prior infection, with chronic pleural fluid and right lung base volume loss/rounded atelectasis. Areas of other right-sided opacification and anterior lung destruction. 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    ASSESSMENT / PLAN:  PULMONARY A: Acute Hypoxic Respiratory Failure with  Recurrent Right Side Pleural Effusion P:   Vent support - 8cc/kg  F/u CXR  F/u ABG VAP bundle  Daily SBT    CARDIOVASCULAR A:  Septic Shock  H/O A.Fib  P:  Cardiac Monitoring  Wean Levophed to Maintain MAP >65  ECHO pending  Trend CVP - 6 this am  Will give albumin x 1  Cortisol ok   RENAL A:   Acute Kidney Injury  ??ESRD -- Per notes from [facility] mention ESRD but no mention of HD, no HD cath or AV fistula seen - unsure baseline Scr - Currently Crt 5.45 and trending down slowly  Metabolic acidosis / Lactic Acidosis  ?some element of hepato-renal  Hyperammonemia  P:   Trend BMP Replace electrolytes as indicated  Trend LA  Trend CK  Renal U/S neg acute issues  Continue gentle volume  No indication for HD at this time   GASTROINTESTINAL A:   End-Stage Liver Cirrhosis   Hyperammonemia  P:   NPO PPI Trend LFT Lactulose 30 mg TID   HEMATOLOGIC A:   Concern for affected coagulation given Liver Cirrhosis  P:  Trend CBC Heparin SQ for VTE   INFECTIOUS A:   Septic Shock in setting of Osteo vs Urosepsis -- urine culture POS for EColi from OSH but u/a not largely impressive.  WBC trending up.  Multiple wounds  P:   Trend WBC and Fever Curve Trend PCT and LA  F/u cultures from here  Continue Vancomycin and Meropenem  MRI R hip - discussed with wound RN    ENDOCRINE A:   DM   P:   Trend Glucose SSI    NEUROLOGIC A:   Hepatic vs Metabolic Encephalopathy   P:   RASS goal: 0/-1 Trend ammonia   Lactulose as above  Add fentanyl gtt for vent synchrony    FAMILY  - Updates: no family at bedside 6/17.  Need to further discuss goals of care.  Known DNR.  Per previous discussions noted with son he would like to gather more information as far as prognosis and diagnosis before he makes further decisions.     - Inter-disciplinary family meet or Palliative Care meeting due by:  12/06/2017   Dirk Dress, NP 11/30/2017  9:25 AM Pager: [phone] or [phone]

## 2017-11-30 NOTE — Progress Notes (Signed)
Patient transported on vent to CT and back to [facility]04 without complication.

## 2017-11-30 NOTE — Progress Notes (Signed)
Pts CVC inadvertently removed by MRI tech while sliding the pt out of the MRI scanner. Bleeding controlled immediately. Vaso and levo switched to peripheral IV. All VS stable at present. [facility] notified and pt seen immediately upon return to unit by P. Hoffman NP. Replacement of CVC vs. Additional PIV insertion discussed with son by provider. Decision at this time is to attempt to manage with PIV's for now. IV team consulted and additional IV placed. All fluids restarted. Will continue to monitor.

## 2017-11-30 NOTE — Progress Notes (Signed)
Urine Culture Results from 6/16 at OSH resulted with E.Coli

## 2017-11-30 NOTE — Consult Note (Signed)
WOC Nurse wound consult note Reason for Consult: Consult requested for multiple wounds.  Pt was found down for unknown period of time and is not very responsive. No family present to discuss plan of care.  Pt is on a low airloss bed to reduce pressure.  She has multiple systemic factors which can impair healing and is critically ill.  Since she is septic; consider possible osteomyelitis to sacrum and right ischium wounds and obtain MRI if aggressive plan of care is desired; discussed with critical care team. Wound type: R hip - dry, unstageable with eschar 100%, 7cm x 5.5cm R sacrum - moist, unstageable with slough 100%, 3cm x 4cm Medial sacrum - dry, unstageable with eschar 100%, 4cm x 6cm Bil buttocks - moist and red, DTI evolving into stage 2 in red patchy  areas, 11cm x 12.5cm R heel - DTI, dry and purple 75%, 3cm x 2cm R upper heel - unstageable, dry and flaky, 0.8cm x 1cm R great toe - full thickness, moist, 3cm x 2cm Pressure Injury POA: Yes Dressing procedure/placement/frequency: Prevalon boots are in place to reduce pressure to BLE.  Santyl for enzymatic debridement of nonviable tissue to sacrum and right ischium. Idelle JoNikki Wiley, RN Please re-consult if further assistance is needed.  Thank-you,  Cammie Mcgeeawn Ariba Lehnen MSN, RN, CWOCN, [place]WCN-AP, CNS [phone]

## 2017-12-01 DIAGNOSIS — J9690 Respiratory failure, unspecified, unspecified whether with hypoxia or hypercapnia: Secondary | ICD-10-CM

## 2017-12-01 DIAGNOSIS — Z515 Encounter for palliative care: Secondary | ICD-10-CM

## 2017-12-01 LAB — PHOSPHORUS: PHOSPHORUS: 5.9 mg/dL — AB (ref 2.5–4.6)

## 2017-12-01 LAB — BLOOD GAS, ARTERIAL
ACID-BASE DEFICIT: 3.3 mmol/L — AB (ref 0.0–2.0)
Bicarbonate: 21.4 mmol/L (ref 20.0–28.0)
DRAWN BY: 10006
FIO2: 40
MECHVT: 400 mL
O2 Saturation: 98.6 %
PATIENT TEMPERATURE: 98.6
PCO2 ART: 40.6 mmHg (ref 32.0–48.0)
PEEP/CPAP: 5 cmH2O
PO2 ART: 130 mmHg — AB (ref 83.0–108.0)
RATE: 20 resp/min
pH, Arterial: 7.343 — ABNORMAL LOW (ref 7.350–7.450)

## 2017-12-01 LAB — URINE CULTURE: Culture: NO GROWTH

## 2017-12-01 LAB — CBC
HCT: 27.5 % — ABNORMAL LOW (ref 36.0–46.0)
Hemoglobin: 8.6 g/dL — ABNORMAL LOW (ref 12.0–15.0)
MCH: 32.8 pg (ref 26.0–34.0)
MCHC: 31.3 g/dL (ref 30.0–36.0)
MCV: 105 fL — AB (ref 78.0–100.0)
Platelets: 111 10*3/uL — ABNORMAL LOW (ref 150–400)
RBC: 2.62 MIL/uL — ABNORMAL LOW (ref 3.87–5.11)
RDW: 17 % — ABNORMAL HIGH (ref 11.5–15.5)
WBC: 24.8 10*3/uL — ABNORMAL HIGH (ref 4.0–10.5)

## 2017-12-01 LAB — GLUCOSE, CAPILLARY
GLUCOSE-CAPILLARY: 111 mg/dL — AB (ref 65–99)
GLUCOSE-CAPILLARY: 101 mg/dL — AB (ref 65–99)
GLUCOSE-CAPILLARY: 148 mg/dL — AB (ref 65–99)
Glucose-Capillary: 162 mg/dL — ABNORMAL HIGH (ref 65–99)
Glucose-Capillary: 167 mg/dL — ABNORMAL HIGH (ref 65–99)

## 2017-12-01 LAB — BASIC METABOLIC PANEL
Anion gap: 14 (ref 5–15)
BUN: 76 mg/dL — ABNORMAL HIGH (ref 6–20)
CALCIUM: 8.2 mg/dL — AB (ref 8.9–10.3)
CO2: 16 mmol/L — ABNORMAL LOW (ref 22–32)
CREATININE: 4.44 mg/dL — AB (ref 0.44–1.00)
Chloride: 108 mmol/L (ref 101–111)
GFR calc non Af Amer: 10 mL/min — ABNORMAL LOW (ref >60)
GFR, EST AFRICAN AMERICAN: 12 mL/min — AB (ref >60)
Glucose, Bld: 128 mg/dL — ABNORMAL HIGH (ref 65–99)
Potassium: 4.7 mmol/L (ref 3.5–5.1)
SODIUM: 138 mmol/L (ref 135–145)

## 2017-12-01 LAB — AMMONIA: Ammonia: 37 umol/L — ABNORMAL HIGH (ref 9–35)

## 2017-12-01 LAB — MAGNESIUM: MAGNESIUM: 1.9 mg/dL (ref 1.7–2.4)

## 2017-12-01 MED ORDER — LACTULOSE 10 GM/15ML PO SOLN
30.0000 g | Freq: Four times a day (QID) | ORAL | Status: DC
  Administered 2017-12-01 – 2017-12-03 (×8): 30 g via ORAL
  Filled 2017-12-01 (×9): qty 45

## 2017-12-01 MED FILL — Fentanyl Citrate Preservative Free (PF) Inj 100 MCG/2ML: INTRAMUSCULAR | Qty: 2 | Status: AC

## 2017-12-01 NOTE — Progress Notes (Addendum)
PULMONARY / CRITICAL CARE MEDICINE   Name: Anna Miranda MRN: [id number] DOB: July 04, 1960    ADMISSION DATE:  11/29/2017 CONSULTATION DATE:  11/29/2017  [facility]   CHIEF COMPLAINT:  Sepsis   HISTORY OF PRESENT ILLNESS:   57 year old female with PMH of End Stage Liver Disease, DM, Recurrent Right Side Pleural Effusion  Presents to [facility] on [facility] with reported 2 weeks of poor appetite and generalized weakness. Family reports that patient has been resistant to care and unwilling to go to the hospital. When she arrived to ED her BP was 60/40. Received Fluid, Vancomycin, Meropenem. CT Chest concerning for necrotic lung from prior infection. U/A with bacteria and Leukocytes. Multiple wounds that are foul smelling and maggots were noted. Required intubation for respiratory distress and hypoxia,however documented in discharge note from [facility] that patient and family have previously expressed wishes for no intubation. On 6/16 [facility].  Recent admission one month ago to [place], no much information available, documented however that she had a chest tube placed for recurrent pleural effusion.     Documented DNR, in which family states patient signed 2 years ago and would like to uphold.   SUBJECTIVE:  Awake and tracking, nods to command More jaundiced today per RT Levo weaned from 20 mg to 14 mg last 24 hours with addition of vaso Family meeting 6/17>> will give patient 24-48 hours to declare herself>> NO HD Pt is a difficult stick  VITAL SIGNS: BP (!) 110/32   Pulse (!) 106   Temp 97.7 F (36.5 C) (Oral)   Resp 20   Ht 5\' 9"  (1.753 m)   Wt 193 lb 9 oz (87.8 kg)   SpO2 100%   BMI 28.58 kg/m   HEMODYNAMICS: CVP:  [6 mmHg-7 mmHg] 7 mmHg  VENTILATOR SETTINGS: Vent Mode: PRVC FiO2 (%):  [40 %] 40 % Set Rate:  [20 bmp] 20 bmp Vt Set:  [400 mL] 400 mL PEEP:  [5 cmH20] 5 cmH20 Plateau Pressure:  [8 cmH20-17 cmH20] 14 cmH20  INTAKE /  OUTPUT: I/O last 3 completed shifts: In: 7865.7 [I.V.:4785.7; Other:450; NG/GT:260; IV Piggyback:2370] Out: 3350 [Urine:2650; Emesis/NG output:700]  PHYSICAL EXAMINATION: General:  Adult female , no distress , awake and following simple commands Neuro:  Lethargic, opens eyes to verbal stimulation, nods head, is not moving extremities PERRLA   HEENT:  NCAT, No LAD Dry MM Cardiovascular:  Remains Tachy, S1, S2, no RMG Lungs:  Bilateral excursion, rhonchi throughout, diminished per bases Abdomen:  Non-distended, NT, BS +, Non-obese Musculoskeletal: chronic vascular insuffiencey , no obvious deformities Skin: multiple pressures ulcers as described below   LABS:  BMET Recent Labs  Lab 11/29/17 2030 11/30/17 0448 12/01/17 0500  NA 136 136 138  K 3.7 3.5 4.7  CL 101 104 108  CO2 20* 21* 16*  BUN 96* 90* 76*  CREATININE 5.79* 5.45* 4.44*  GLUCOSE 146* 185* 128*    Electrolytes Recent Labs  Lab 11/29/17 2030 11/30/17 0448 12/01/17 0500  CALCIUM 8.1* 7.9* 8.2*  MG 1.7 2.1 1.9  PHOS 6.8* 6.2* 5.9*    CBC Recent Labs  Lab 11/29/17 2030 11/30/17 0448 12/01/17 0844  WBC 25.6* 31.1* 24.8*  HGB 9.9* 9.7* 8.6*  HCT 30.5* 29.8* 27.5*  PLT 221 198 PENDING    Coag's Recent Labs  Lab 11/29/17 2030  APTT 36  INR 2.14    Sepsis Markers Recent Labs  Lab 11/29/17 2030 11/30/17 0448 11/30/17 0504  11/30/17 0848 11/30/17 1202  LATICACIDVEN 3.5*  --  2.5* 2.3* 2.1*  PROCALCITON 1.05 1.31  --   --   --     ABG Recent Labs  Lab 11/29/17 2010 11/30/17 2005 12/01/17 0430  PHART 7.444 7.360 7.343*  PCO2ART 29.9* 37.7 40.6  PO2ART 108 139* 130*    Liver Enzymes Recent Labs  Lab 11/29/17 2030  AST 35  ALT 11*  ALKPHOS 103  BILITOT 3.5*  ALBUMIN 1.8*    Cardiac Enzymes Recent Labs  Lab 11/30/17 0448 11/30/17 0830 11/30/17 1638  TROPONINI 0.06* 0.05* 0.04*    Glucose Recent Labs  Lab 11/30/17 1153 11/30/17 1528 11/30/17 2008 11/30/17 2321  12/01/17 0332 12/01/17 0724  GLUCAP 150* 129* 121* 111* 111* 101*    Imaging Mr Hip Right Wo Contrast  Result Date: 11/30/2017 CLINICAL DATA:  Multiple soft tissue wounds with concern for osteomyelitis of the right sacrum and ischium. EXAM: MR OF THE RIGHT HIP WITHOUT CONTRAST TECHNIQUE: Multiplanar, multisequence MR imaging was performed. No intravenous contrast was administered. COMPARISON:  KUB 11/30/2017 FINDINGS: Bones: Susceptibility artifacts emanating from the right acetabulum status post fixation. There is degenerative disc disease with disc flattening of the included lower lumbar spine at L4-5 and L5-S1. Marrow signal abnormality of the sacral ala bilaterally, compatible with evolving sacral insufficiency fractures. Raise concern for Marrow signal abnormality of the distal sacrum and coccyx, series 16/17 through 20 and 22 through 24 changes of acute osteomyelitis given soft tissue defects overlying this finding. Articular cartilage and labrum Articular cartilage: Joint space narrowing consistent with chondral thinning of both hips. Labrum:  Suboptimally assessed due to lack of joint distention. Joint or bursal effusion Joint effusion:  No joint effusion Bursae: Bilateral greater trochanteric bursitis. Muscles and tendons Muscles and tendons: Mild diffuse intramuscular edema compatible with myositis. No drainable fluid collections are noted. Other findings Miscellaneous: Subcutaneous soft tissue edema is noted of the included pelvis compatible with third spacing of fluid and/or cellulitis. No drainable fluid collections are noted. Foley catheter tube is in place with balloon deployed within the decompressed urinary bladder. There is a small to moderate amount of free fluid in the cul-de-sac. Rectal tube is in place. IMPRESSION: 1. Marrow signal abnormalities of the sacral ala bilaterally compatible with sacral insufficiency or stress fractures. 2. Marrow signal abnormalities of the distal sacrum  and coccyx with soft tissue ulceration raise concern for changes acute osteomyelitis. 3. Lumbar spondylosis. 4. Diffuse pelvic subcutaneous and intramuscular edema compatible cellulitis and/or myositis. Component of third spacing of fluid is also possibility given small to moderate free fluid in the pelvis. 5. Bilateral greater trochanteric bursitis. 6. Susceptibility artifacts about the right acetabulum. Foley and rectal tube are in place. Electronically Signed   By: Tollie Eth M.D.   On: 11/30/2017 23:00   Dg Abd Portable 1v  Result Date: 11/30/2017 CLINICAL DATA:  Abdominal distention EXAM: PORTABLE ABDOMEN - 1 VIEW COMPARISON:  None. FINDINGS: Nonobstructive bowel gas pattern. No free air or organomegaly. No suspicious calcification. IVC filter is in place. Postoperative changes in the right bony pelvis. IMPRESSION: No acute findings. Electronically Signed   By: Charlett Nose M.D.   On: 11/30/2017 10:37     STUDIES:  CT C/A/P 6/15 > Appearance of the right hemithorax which favors sequelae of prior infection, with chronic pleural fluid and right lung base volume loss/rounded atelectasis. Areas of other right-sided opacification and anterior lung destruction. Fluid secretions in the Endobronchial tree, nonspecific left sided thyroid nodule  Renal u/s 6/16>>> neg acute  2D echo 6/17>>>  MRI R hip 6/17>>> Xray R foot 6/16>>> no definitive evidence osteo  CULTURES: Blood 6/16 >> Sputum 6/16 >> U/A 6/16 (OSH)>>EColi and Proteus Mirabilis  ANTIBIOTICS: Vancomycin 6/15 >> Meropenem 6/16 >>  SIGNIFICANT EVENTS: 6/15 > Presents to ED  6/16 > [facility]red to [facility]   LINES/TUBES: ETT 6/15 >>> Left IJ CVC (OSH) 6/15 >>   DISCUSSION: 57 year old female with NASH cirrhosis, recurrent R sided effusion admitted 6/16 from [facility] with acute respiratory failure, septic shock on levophed, AKI.    ASSESSMENT / PLAN:  PULMONARY A: Acute Hypoxic Respiratory Failure with  Recurrent Right  Side Pleural Effusion P:   Vent support - 8cc/kg  Trend CXR  Trend ABG VAP bundle  Daily SBT  Wean FiO2 and PEEP as able   CARDIOVASCULAR A:  Septic Shock  H/O A.Fib  Able to wean levo with addition of vaso P:  Cardiac Monitoring  Continue to Wean Levophed to Maintain MAP >65  ECHO >> EF 60-65% , LV cavity size normal Trend CVP - 6 this am  Will give albumin x 1  Cortisol ok   RENAL A:   Acute Kidney Injury  ??ESRD -- Per notes from [facility] mention ESRD but no mention of HD, no HD cath or AV fistula seen - unsure baseline Scr - Currently Crt 4.44  and trending down slowly  Metabolic acidosis / Lactic Acidosis  ?some element of hepato-renal  Hyperammonemia  P:   Trend BMET Replace electrolytes as indicated  Trend LA >> Unable to draw 6/18 ( difficult stick) Last 2.1 Trend CK  Renal U/S neg acute issues  Continue gentle volume  No indication for HD at this time   GASTROINTESTINAL A:   End-Stage Liver Cirrhosis   Hyperammonemia  Worsening Jaundice  No output to lactulose>> ammonia level downtrending P:   NPO PPI Trend LFT Increase Lactulose 30 mg Q 6   HEMATOLOGIC A:   Concern for affected coagulation given Liver Cirrhosis  HGB drop 3 grams overnight ( Did get 1 L IVF bolus overnight for CVP of  5, + 4.6 L) Suspect hemodilutional P:  Trend CBC Monitor for any obvious bleeding Trend PT/INR Heparin SQ for VTE  Transfuse for HGB < 7  INFECTIOUS A:   Septic Shock in setting of Osteo vs Urosepsis -- urine culture POS for EColi from OSH but u/a not largely impressive.  WBC with slight down trend.  Multiple wounds  Afebrile P:   Trend WBC and Fever Curve Trend PCT and LA  F/u cultures from here  Continue Vancomycin and Meropenem  MRI R hip - discussed with wound RN  Urine Culture Sensitivities from [place] in chart  ENDOCRINE A:   DM   P:   Trend Glucose SSI    NEUROLOGIC A:   Hepatic vs Metabolic Encephalopathy Increasing jaundice    P:   RASS goal: 0/-1 Trend ammonia  Lactulose as above  Add fentanyl gtt for vent synchrony    FAMILY  - Updates: no family at bedside 6/18.  Palliative care meeting 6/17>. Plan is to give patient 24-48 hours to declare herself. Son Shalisha Clausing is in agreement with considering compassionate extubation/focusing on comfort measures  should the patient not have indications of significant meaningful recovery within the next 1-2 days. Creatinine is down trending as is Ammonia.   - Inter-disciplinary family meet or Palliative Care meeting due by:  12/06/2017>> Completed 11/30/2017  Bevelyn Ngo, AGACNP-BC 12/01/2017  9:39 AM Pager:  [phone]

## 2017-12-01 NOTE — Progress Notes (Signed)
Unable to do CVP patient does not have central line

## 2017-12-01 NOTE — Care Management Note (Signed)
Case Management Note  Patient Details  Name: Anna Miranda MRN: [id number] Date of Birth: [birth year]  Subjective/Objective:   Pt found down in her yard - unknown down time.  Pt suffered cardiac arrest                Action/Plan:  PTA from home.  Pt is now ventilated on sedation and pressors   Expected Discharge Date:                  Expected Discharge Plan:     In-House Referral:  Clinical Social Work  Discharge planning Services  CM Consult  Post Acute Care Choice:    Choice offered to:     DME Arranged:    DME Agency:     HH Arranged:    HH Agency:     Status of Service:     If discussed at [employer] Meetings, dates discussed:    Additional Comments:  Cherylann ParrClaxton, Rosalie Buenaventura S, RN 12/01/2017, 4:16 PM

## 2017-12-01 NOTE — Progress Notes (Signed)
Daily Progress Note   Patient Name: Anna Miranda       Date: 12/01/2017 DOB: [birth year]  Age: 57 y.o. MRN#: [id number] Attending Physician: Anna Doom, MD Primary Care Physician: No primary care provider on file. Admit Date: 11/29/2017  Reason for Consultation/Follow-up: Establishing goals of care  Subjective: Chart reviewed.  Discussed with bedside staff.  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  Care plan discussed with son, Anna Miranda, by Anna Miranda on 6/17 with plan for continuation of therapies for 48 hours.    On exam, she briefly made eye contact, then drifted off.  No family present at the bedside.  Length of Stay: 2  Current Medications: Scheduled Meds:  . chlorhexidine gluconate (MEDLINE KIT)  15 mL Mouth Rinse BID  . Chlorhexidine Gluconate Cloth  6 each Topical Daily  . collagenase   Topical Daily  . docusate  100 mg Per Tube BID  . heparin  5,000 Units Subcutaneous Q8H  . insulin aspart  0-15 Units Subcutaneous Q4H  . lactulose  30 g Oral Q6H  . mouth rinse  15 mL Mouth Rinse 10 times per day  . mupirocin ointment  1 application Nasal BID  . pantoprazole sodium  40 mg Per Tube Daily    Continuous Infusions: . sodium chloride    . sodium chloride 75 mL/hr at 12/01/17 1116  . fentaNYL infusion INTRAVENOUS 100 mcg/hr (12/01/17 1119)  . meropenem (MERREM) IV 500 mg (12/01/17 1123)  . norepinephrine (LEVOPHED) Adult infusion 14 mcg/min (12/01/17 1135)  . vancomycin Stopped (11/30/17 0020)  . vasopressin (PITRESSIN) infusion - *FOR SHOCK* 0.03 Units/min (12/01/17 1116)    PRN Meds: sodium chloride, fentaNYL, midazolam, midazolam  Physical Exam    General: Ill appearing, intubated.  Heart: Regular rate and rhythm. No murmur appreciated. Lungs: Mechanical breath sounds Abdomen: Soft, nondistended, positive bowel sounds.        Vital Signs: BP (!) 113/35   Pulse (!) 143   Temp 98.1 F (36.7 C) (Oral)   Resp 20   Ht _0  (1.753 m)   Wt 87.8 kg (193 lb 9 oz)   SpO2 100%   BMI 28.58 kg/m  SpO2: SpO2: 100 % O2 Device: O2  Device: Ventilator O2 Flow Rate:    Intake/output summary:   Intake/Output Summary (Last 24 hours) at 12/01/2017 1400 Last data filed at 12/01/2017 1200 Gross per 24 hour  Intake 3394.79 ml  Output 2400 ml  Net 994.79 ml   LBM:   Baseline Weight: Weight: 85.8 kg (189 lb 2.5 oz) Most recent weight: Weight: 87.8 kg (193 lb 9 oz)       Palliative Assessment/Data:      Patient Active Problem List   Diagnosis Date Noted  . Pressure injury of skin 11/30/2017  . Acute respiratory failure with hypoxemia ([place])   . Sepsis ([place]) 11/29/2017  . Cirrhosis ([place]) 11/29/2017  . DM (diabetes mellitus) ([place]) 11/29/2017    Palliative Care Assessment & Plan   Patient Profile: 57 year old lady with history of end-stage liver disease diabetes recurrent right-sided pleural effusion, presented to outside hospital with 2 weeks worth of poor appetite and generalized weakness. Noted to have low blood pressures noted to have CT chest concerning for necrotic lung from prior infection, noted to have possible urinary tract infection multiple foul-smelling wounds. Was intubated for respiratory distress at outside hospital and was transferred to [facility] on pressors.  Recommendations/Plan: - Continue trial on broad-spectrum antibiotics, pressors, intubation and mechanical ventilation. Plan to review clinical course and re-address GOC based on her clinical course again with son tomorrow.  I believe that we may be looking at a situation for one-way extubation.   Code Status:    Code Status Orders  (From admission,  onward)        Start     Ordered   11/29/17 1931  Limited resuscitation (code)  Continuous    Question Answer Comment  In the event of cardiac or respiratory ARREST: Initiate Code Blue, Call Rapid Response No   In the event of cardiac or respiratory ARREST: Perform CPR No   In the event of cardiac or respiratory ARREST: Perform Intubation/Mechanical Ventilation Yes   In the event of cardiac or respiratory ARREST: Use NIPPV/BiPAp only if indicated Yes   In the event of cardiac or respiratory ARREST: Administer ACLS medications if indicated No   In the event of cardiac or respiratory ARREST: Perform Defibrillation or Cardioversion if indicated No      11/29/17 1931    Code Status History    This patient has a current code status but no historical code status.       Prognosis:  Guarded  Discharge Planning:  TBD  Care plan was discussed with Anna Miranda  Thank you for allowing the Palliative Medicine Team to assist in the care of this patient.   Total Time 20 Prolonged Time Billed No      Greater than 50%  of this time was spent counseling and coordinating care related to the above assessment and plan.  Anna Rough, MD  Please contact Palliative Medicine Team phone at [phone] for questions and concerns.

## 2017-12-02 ENCOUNTER — Inpatient Hospital Stay (HOSPITAL_COMMUNITY): Payer: Medicare Other

## 2017-12-02 DIAGNOSIS — Z7189 Other specified counseling: Secondary | ICD-10-CM

## 2017-12-02 DIAGNOSIS — Z9911 Dependence on respirator [ventilator] status: Secondary | ICD-10-CM

## 2017-12-02 LAB — COMPREHENSIVE METABOLIC PANEL
ALBUMIN: 2.4 g/dL — AB (ref 3.5–5.0)
ALT: 11 U/L — ABNORMAL LOW (ref 14–54)
ANION GAP: 9 (ref 5–15)
AST: 25 U/L (ref 15–41)
Alkaline Phosphatase: 100 U/L (ref 38–126)
BUN: 68 mg/dL — AB (ref 6–20)
CO2: 21 mmol/L — ABNORMAL LOW (ref 22–32)
Calcium: 8.3 mg/dL — ABNORMAL LOW (ref 8.9–10.3)
Chloride: 107 mmol/L (ref 101–111)
Creatinine, Ser: 3.87 mg/dL — ABNORMAL HIGH (ref 0.44–1.00)
GFR calc Af Amer: 14 mL/min — ABNORMAL LOW (ref >60)
GFR calc non Af Amer: 12 mL/min — ABNORMAL LOW (ref >60)
GLUCOSE: 155 mg/dL — AB (ref 65–99)
POTASSIUM: 3.4 mmol/L — AB (ref 3.5–5.1)
SODIUM: 137 mmol/L (ref 135–145)
Total Bilirubin: 3.7 mg/dL — ABNORMAL HIGH (ref 0.3–1.2)
Total Protein: 5.9 g/dL — ABNORMAL LOW (ref 6.5–8.1)

## 2017-12-02 LAB — BLOOD GAS, ARTERIAL
ACID-BASE DEFICIT: 3.9 mmol/L — AB (ref 0.0–2.0)
BICARBONATE: 21.9 mmol/L (ref 20.0–28.0)
Drawn by: 10006
FIO2: 40
LHR: 20 {breaths}/min
MECHVT: 400 mL
O2 Saturation: 98.8 %
PATIENT TEMPERATURE: 98.6
PCO2 ART: 49.3 mmHg — AB (ref 32.0–48.0)
PEEP/CPAP: 5 cmH2O
PO2 ART: 150 mmHg — AB (ref 83.0–108.0)
pH, Arterial: 7.271 — ABNORMAL LOW (ref 7.350–7.450)

## 2017-12-02 LAB — CULTURE, RESPIRATORY W GRAM STAIN: Culture: NORMAL

## 2017-12-02 LAB — CULTURE, RESPIRATORY

## 2017-12-02 LAB — MAGNESIUM: MAGNESIUM: 1.7 mg/dL (ref 1.7–2.4)

## 2017-12-02 LAB — LACTIC ACID, PLASMA: LACTIC ACID, VENOUS: 2.5 mmol/L — AB (ref 0.5–1.9)

## 2017-12-02 LAB — PHOSPHORUS: Phosphorus: 6 mg/dL — ABNORMAL HIGH (ref 2.5–4.6)

## 2017-12-02 LAB — CBC
HCT: 30.5 % — ABNORMAL LOW (ref 36.0–46.0)
Hemoglobin: 9.5 g/dL — ABNORMAL LOW (ref 12.0–15.0)
MCH: 32.3 pg (ref 26.0–34.0)
MCHC: 31.1 g/dL (ref 30.0–36.0)
MCV: 103.7 fL — ABNORMAL HIGH (ref 78.0–100.0)
PLATELETS: 142 10*3/uL — AB (ref 150–400)
RBC: 2.94 MIL/uL — ABNORMAL LOW (ref 3.87–5.11)
RDW: 16.5 % — AB (ref 11.5–15.5)
WBC: 23.8 10*3/uL — AB (ref 4.0–10.5)

## 2017-12-02 LAB — PROTIME-INR
INR: 1.64
Prothrombin Time: 19.3 seconds — ABNORMAL HIGH (ref 11.4–15.2)

## 2017-12-02 LAB — GLUCOSE, CAPILLARY
GLUCOSE-CAPILLARY: 103 mg/dL — AB (ref 65–99)
GLUCOSE-CAPILLARY: 104 mg/dL — AB (ref 65–99)
GLUCOSE-CAPILLARY: 120 mg/dL — AB (ref 65–99)
GLUCOSE-CAPILLARY: 126 mg/dL — AB (ref 65–99)
GLUCOSE-CAPILLARY: 126 mg/dL — AB (ref 65–99)
GLUCOSE-CAPILLARY: 162 mg/dL — AB (ref 65–99)
Glucose-Capillary: 131 mg/dL — ABNORMAL HIGH (ref 65–99)

## 2017-12-02 LAB — PROCALCITONIN: PROCALCITONIN: 0.75 ng/mL

## 2017-12-02 NOTE — Progress Notes (Signed)
Daily Progress Note   Patient Name: Anna Miranda       Date: 12/02/2017 DOB: 1961/02/12  Age: 57 y.o. MRN#: [id number] Attending Physician: Juanito Doom, MD Primary Care Physician: No primary care provider on file. Admit Date: 11/29/2017  Reason for Consultation/Follow-up: Establishing goals of care  Subjective: Chart reviewed.  Discussed with bedside staff.  I saw and examined Anna Miranda today.  She was awake and alert and was able to respond appropriately to yes and no questions by shaking her head.  We reviewed her current clinical situation including the fact that she is now dependent on ventilator.  I asked if this is a situation that she had considered before and she indicated yes.  I then asked if she desires to continue with medical ventilation to which she indicated no.  I discussed with her a plan to focus on comfort and work to withdraw mechanical ventilation and pressor support understanding that time will be short.  She indicated by nodding that this would be her desire.  We discussed timing of withdrawing ventilator support to be either today or tomorrow.  I asked if she wanted me to call and speak with her son about this plan to which she indicated yes.  I told her that I will call and speak with Chrissie Noa.  I called and was able to reach her son via phone.  He lives in [place] and states that he is working to get together gas money to come and visit his mother, but he is not sure if he will be able to do so.  We talked about her prior expressed wishes and he states that she has been clear that she would not want to be maintained on life support.  We talked about working to liberate her from ventilator understanding that she will likely die within a very short period of time  due to the fact that she is not maintaining her own respiratory drive.  Reports understanding this and wants to make sure that she is comfortable moving forward.  I then spoke with Kenneth's wife, Wahak Hotrontk, regarding our conversation.  She is also in agreement that Anna Miranda wishes have been clear, but she requested that plan be for extubation tomorrow as she is hopeful that they will be able to, with enough money  to get to [facility] to see her prior to withdrawing life support.   Length of Stay: 3  Current Medications: Scheduled Meds:  . chlorhexidine gluconate (MEDLINE KIT)  15 mL Mouth Rinse BID  . Chlorhexidine Gluconate Cloth  6 each Topical Daily  . collagenase   Topical Daily  . docusate  100 mg Per Tube BID  . heparin  5,000 Units Subcutaneous Q8H  . insulin aspart  0-15 Units Subcutaneous Q4H  . lactulose  30 g Oral Q6H  . mouth rinse  15 mL Mouth Rinse 10 times per day  . mupirocin ointment  1 application Nasal BID  . pantoprazole sodium  40 mg Per Tube Daily    Continuous Infusions: . sodium chloride    . sodium chloride 100 mL/hr at 12/02/17 1300  . fentaNYL infusion INTRAVENOUS 100 mcg/hr (12/02/17 1200)  . meropenem (MERREM) IV Stopped (12/02/17 1104)  . norepinephrine (LEVOPHED) Adult infusion 15 mcg/min (12/02/17 1200)  . vancomycin Stopped (12/02/17 0020)  . vasopressin (PITRESSIN) infusion - *FOR SHOCK* 0.03 Units/min (12/02/17 1200)    PRN Meds: sodium chloride, fentaNYL, midazolam, midazolam  Physical Exam  General: Ill appearing, intubated.  Heart: Regular rate and rhythm. No murmur appreciated. Lungs: Mechanical breath sounds Abdomen: Soft, nondistended, positive bowel sounds.        Vital Signs: BP (!) 105/39   Pulse 62   Temp 97.7 F (36.5 C) (Oral)   Resp (!) 23   Ht 5' 9"  (1.753 m)   Wt 90.5 kg (199 lb 8.3 oz)   SpO2 100%   BMI 29.46 kg/m  SpO2: SpO2: 100 % O2 Device: O2 Device: Ventilator O2 Flow Rate:    Intake/output summary:    Intake/Output Summary (Last 24 hours) at 12/02/2017 1443 Last data filed at 12/02/2017 1300 Gross per 24 hour  Intake 4146.56 ml  Output 2175 ml  Net 1971.56 ml   LBM:   Baseline Weight: Weight: 85.8 kg (189 lb 2.5 oz) Most recent weight: Weight: 90.5 kg (199 lb 8.3 oz)       Palliative Assessment/Data:      Patient Active Problem List   Diagnosis Date Noted  . Pressure injury of skin 11/30/2017  . Acute respiratory failure with hypoxemia ([place])   . Sepsis ([place]) 11/29/2017  . Cirrhosis ([place]) 11/29/2017  . DM (diabetes mellitus) ([place]) 11/29/2017    Palliative Care Assessment & Plan   Patient Profile: 57 year old lady with history of end-stage liver disease diabetes recurrent right-sided pleural effusion, presented to outside hospital with 2 weeks worth of poor appetite and generalized weakness. Noted to have low blood pressures noted to have CT chest concerning for necrotic lung from prior infection, noted to have possible urinary tract infection multiple foul-smelling wounds. Was intubated for respiratory distress at outside hospital and was transferred to [facility] on pressors.  Recommendations/Plan: -No escalation of care.  Plan for withdrawal of life support.  Family requesting this happen tomorrow as they are trying to arrange to get to see her.   -I agree that this will be a terminal extubation with significant concern for symptoms immediately following extubation.  I would recommend extubating her while continuing on opioid for shortness of breath (could either continue with fentanyl infusion or transition to morphine) and also giving sedating medication (1-70m of ativan).  I will check in with primary team tomorrow and will plan on being available for extubation if this would be helpful to ensure symptoms are well managed.  -Her son is trying  to get to town in order to see her again before she dies.  Reports that getting enough money for gas has been a major issue for him.   I will reach out to social work to see if there is any other support available to assist in getting to [facility] before tomorrow.  Code Status:    Code Status Orders  (From admission, onward)        Start     Ordered   11/29/17 1931  Limited resuscitation (code)  Continuous    Question Answer Comment  In the event of cardiac or respiratory ARREST: Initiate Code Blue, Call Rapid Response No   In the event of cardiac or respiratory ARREST: Perform CPR No   In the event of cardiac or respiratory ARREST: Perform Intubation/Mechanical Ventilation Yes   In the event of cardiac or respiratory ARREST: Use NIPPV/BiPAp only if indicated Yes   In the event of cardiac or respiratory ARREST: Administer ACLS medications if indicated No   In the event of cardiac or respiratory ARREST: Perform Defibrillation or Cardioversion if indicated No      11/29/17 1931    Code Status History    This patient has a current code status but no historical code status.       Prognosis:  Poor  Discharge Planning:  Anticipate hospital death  Care plan was discussed with Dr. Lake Bells  Thank you for allowing the Palliative Medicine Team to assist in the care of this patient.   Total Time 50 Prolonged Time Billed No      Greater than 50%  of this time was spent counseling and coordinating care related to the above assessment and plan.  Micheline Rough, MD  Please contact Palliative Medicine Team phone at [phone] for questions and concerns.

## 2017-12-02 NOTE — Progress Notes (Signed)
PULMONARY / CRITICAL CARE MEDICINE   Name: Anna Miranda MRN: [id number] DOB: 05/06/1961    ADMISSION DATE:  11/29/2017 CONSULTATION DATE:  11/29/2017  REFERRING MD:  [facility]   CHIEF COMPLAINT:  Sepsis   HISTORY OF PRESENT ILLNESS:   57 year old female with PMH of End Stage Liver Disease, DM, Recurrent Right Side Pleural Effusion  Presents to [facility] on 6/15 with reported 2 weeks of poor appetite and generalized weakness. Family reports that patient has been resistant to care and unwilling to go to the hospital. When she arrived to ED her BP was 60/40. Received Fluid, Vancomycin, Meropenem. CT Chest concerning for necrotic lung from prior infection. U/A with bacteria and Leukocytes. Multiple wounds that are foul smelling and maggots were noted. Required intubation for respiratory distress and hypoxia,however documented in discharge note from [facility] that patient and family have previously expressed wishes for no intubation. On 6/16 presents to [facility] on Levophed.  Recent admission one month ago to [facility], no much information available, documented however that she had a chest tube placed for recurrent pleural effusion.     Documented DNR, in which family states patient signed 2 years ago and would like to uphold.   SUBJECTIVE:  No acute change overnight.  Remains on levophed, Sbp 70-80s.  Failed SBT in <5 mins.  Tachypneic, Vt Awake, nods appropriately.    VITAL SIGNS: BP (!) 105/51   Pulse (!) 55   Temp (!) 96.8 F (36 C) (Axillary)   Resp 15   Ht 5\' 9"  (1.753 m)   Wt 90.5 kg (199 lb 8.3 oz)   SpO2 100%   BMI 29.46 kg/m   HEMODYNAMICS:    VENTILATOR SETTINGS: Vent Mode: PSV;CPAP FiO2 (%):  [40 %] 40 % Set Rate:  [20 bmp] 20 bmp Vt Set:  [400 mL] 400 mL PEEP:  [5 cmH20] 5 cmH20 Pressure Support:  [5 cmH20] 5 cmH20 Plateau Pressure:  [13 cmH20-16 cmH20] 14 cmH20  INTAKE / OUTPUT: I/O last 3 completed shifts: In: 5741.2 [I.V.:5137.8; IV  Piggyback:603.5] Out: 4275 [Urine:2425; Emesis/NG output:1850]  PHYSICAL EXAMINATION: General:  Chronically ill appearing female, NAD  Neuro: drowsy but easily arousable, tracks, nods appropriately, follows commands, MAE HEENT:  ETT, mm moist  Cardiovascular:  s1s2 rrr Lungs:  resps even non labored on vent, scattered rhonchi  Musculoskeletal: no sig edema Skin: multiple pressures ulcers as described below   LABS:  BMET Recent Labs  Lab 11/30/17 0448 12/01/17 0500 12/02/17 0425  NA 136 138 137  K 3.5 4.7 3.4*  CL 104 108 107  CO2 21* 16* 21*  BUN 90* 76* 68*  CREATININE 5.45* 4.44* 3.87*  GLUCOSE 185* 128* 155*    Electrolytes Recent Labs  Lab 11/30/17 0448 12/01/17 0500 12/02/17 0425  CALCIUM 7.9* 8.2* 8.3*  MG 2.1 1.9 1.7  PHOS 6.2* 5.9* 6.0*    CBC Recent Labs  Lab 11/30/17 0448 12/01/17 0844 12/02/17 0425  WBC 31.1* 24.8* 23.8*  HGB 9.7* 8.6* 9.5*  HCT 29.8* 27.5* 30.5*  PLT 198 111* 142*    Coag's Recent Labs  Lab 11/29/17 2030 12/02/17 0425  APTT 36  --   INR 2.14 1.64    Sepsis Markers Recent Labs  Lab 11/29/17 2030 11/30/17 0448  11/30/17 0848 11/30/17 1202 12/02/17 0425  LATICACIDVEN 3.5*  --    < > 2.3* 2.1* 2.5*  PROCALCITON 1.05 1.31  --   --   --  0.75   < > =  values in this interval not displayed.    ABG Recent Labs  Lab 11/30/17 2005 12/01/17 0430 12/02/17 0520  PHART 7.360 7.343* 7.271*  PCO2ART 37.7 40.6 49.3*  PO2ART 139* 130* 150*    Liver Enzymes Recent Labs  Lab 11/29/17 2030 12/02/17 0425  AST 35 25  ALT 11* 11*  ALKPHOS 103 100  BILITOT 3.5* 3.7*  ALBUMIN 1.8* 2.4*    Cardiac Enzymes Recent Labs  Lab 11/30/17 0448 11/30/17 0830 11/30/17 1638  TROPONINI 0.06* 0.05* 0.04*    Glucose Recent Labs  Lab 12/01/17 1138 12/01/17 1632 12/01/17 2002 12/02/17 0018 12/02/17 0347 12/02/17 0807  GLUCAP 148* 162* 167* 162* 131* 103*    Imaging Dg Chest Port 1 View  Result Date:  12/02/2017 CLINICAL DATA:  Respiratory failure and shortness of breath. History of cirrhosis, diabetes. EXAM: PORTABLE CHEST 1 VIEW COMPARISON:  Portable chest x-ray of November 29, 2017 FINDINGS: The left lung is well-expanded. There is no focal infiltrate. A small amount of pleural fluid on the left may be present. On the right there is persistent volume loss. Pleural thickening versus loculated pleural fluid is noted in the apex and at the lung base. The cardiac silhouette is enlarged. The pulmonary vascularity is normal. The endotracheal tube tip projects 5.6 cm above the carina. The esophagogastric tube tip and proximal port project below the GE junction. The left internal jugular venous catheter previously demonstrated is not clearly evident today. IMPRESSION: Fairly stable appearance of the chest. Volume loss on the right with increased pleuroparenchymal changes that may be postsurgical or post infectious. The support tubes are in reasonable position. Electronically Signed   By: David  [place] M.D.   On: 12/02/2017 07:51     STUDIES:  CT C/A/P 6/15 > Appearance of the right hemithorax which favors sequelae of prior infection, with chronic pleural fluid and right lung base volume loss/rounded atelectasis. Areas of other right-sided opacification and anterior lung destruction. Fluid secretions in the Endobronchial tree, nonspecific left sided thyroid nodule  Renal u/s 6/16>>> neg acute  2D echo 6/17>>> EF 60-65%  MRI R hip 6/17>>>1. Marrow signal abnormalities of the sacral ala bilaterally compatible with sacral insufficiency or stress fractures. 2. Marrow signal abnormalities of the distal sacrum and coccyx with soft tissue ulceration raise concern for changes acute osteomyelitis. 3. Lumbar spondylosis. 4. Diffuse pelvic subcutaneous and intramuscular edema compatible cellulitis and/or myositis. Component of third spacing of fluid is also possibility given small to moderate free fluid in the  pelvis. 5. Bilateral greater trochanteric bursitis. 6. Susceptibility artifacts about the right acetabulum. Foley and rectal tube are in place. 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    ASSESSMENT / PLAN:  PULMONARY A: Acute Hypoxic Respiratory Failure with  Recurrent Right Side Pleural Effusion P:   Vent support - 8cc/kg  Trend CXR  VAP bundle  Daily SBT  Wean FiO2 and PEEP as able See discussion below   CARDIOVASCULAR A:  Septic Shock  H/O A.Fib  Able to wean levo with addition of  vaso P:  Cardiac Monitoring  Continue to Wean Levophed to Maintain MAP >65 - do not increase  Monitor CVP   RENAL A:   Acute Kidney Injury  ??ESRD -- Per notes from [facility] mention ESRD but no mention of HD, no HD cath or AV fistula seen - unsure baseline Scr - Currently Crt 4.44  and trending down slowly  Metabolic acidosis / Lactic Acidosis  ?some element of hepato-renal  Hyperammonemia  P:   Trend BMET Replace electrolytes as indicated  Trend lactate, ammonia  Continue lactulose  Trend CK  Continue gentle volume    GASTROINTESTINAL A:   End-Stage Liver Cirrhosis   Hyperammonemia  Worsening Jaundice  No output to lactulose>> ammonia level downtrending P:   NPO PPI Trend LFT Continue Lactulose 30 mg Q6   HEMATOLOGIC A:   Concern for affected coagulation given Liver Cirrhosis  HGB drop 3 grams overnight ( Did get 1 L IVF bolus overnight for CVP of  5, + 4.6 L) Suspect hemodilutional P:  Trend  CBC Monitor for any obvious bleeding Trend PT/INR Heparin SQ for VTE  Transfuse for HGB < 7  INFECTIOUS A:   Septic Shock in setting of Osteo vs Urosepsis -- urine culture POS for EColi from OSH but u/a not largely impressive.  WBC with slight down trend.  Multiple wounds  Sacral Osteomyelitis  P:   Trend WBC and Fever Curve Trend PCT and LA  Continue Vancomycin and Meropenem  Follow cultures   ENDOCRINE A:   DM   P:   Trend Glucose SSI    NEUROLOGIC A:   Hepatic vs Metabolic Encephalopathy Increasing jaundice   P:   RASS goal: 0/-1 Trend ammonia  Lactulose as above  Continue fentanyl gtt for vent synchrony    FAMILY  - Updates: no family at bedside 6/19.  Reviewed palliative care notes from yesterday.  Plan for palliative care to meet with family again today.  She has not made much progress over last 48 hours.  Remains pressor dependent, quickly fails weaning attempts.  Some ?of one-way extubation in palliative care notes, however, given how quickly she fails attempts at PS I would be concerned about extubating without a fair amount of morphine or other sedation.  Suspect she may quickly have some degree of respiratory distress post extubation and should probably plan for a more carefully planned withdrawal of care rather than a typical one-way extubation as she will most surely fail almost immediately.   - Inter-disciplinary family meet or Palliative Care meeting due by:  12/06/2017>> Completed 11/30/2017   Dirk DressKaty Warren Lindahl, NP 12/02/2017  9:11 AM Pager: [phone] or [phone]

## 2017-12-02 NOTE — Progress Notes (Signed)
Pharmacy Antibiotic Note  Lu Duffelancy Ibach is a 57 y.o. female admitted transferred from OSH on 11/29/2017 with sepsis.  [facility]: CT Chest concerning for necrotic lung from prior infection. Multiple wounds that were foul smelling and maggots were noted. UTI from OSH hospital with pan-sensitive E.coli and proteus.   -Currently on D#4 of Vanc/Merrem. MRI of hip with osteo. WBC remains elevated at 23.8. SCr down to 3.87.   Plan: -Meropenem 500mg  q12h.  -Vancomycin 1000mg  IV q48h. Draw VT prior to next dose on 6/20 -Watch renal fxn  -GOC discussion with son today. Possible one-way extubation soon.  Height: 5\' 9"  (175.3 cm) Weight: 199 lb 8.3 oz (90.5 kg) IBW/kg (Calculated) : 66.2  Temp (24hrs), Avg:97.6 F (36.4 C), Min:96.8 F (36 C), Max:98.1 F (36.7 C)  Recent Labs  Lab 11/29/17 2028 11/29/17 2030 11/30/17 0448 11/30/17 0504 11/30/17 0848 11/30/17 1202 12/01/17 0500 12/01/17 0844 12/02/17 0425  WBC  --  25.6* 31.1*  --   --   --   --  24.8* 23.8*  CREATININE  --  5.79* 5.45*  --   --   --  4.44*  --  3.87*  LATICACIDVEN  --  3.5*  --  2.5* 2.3* 2.1*  --   --  2.5*  VANCORANDOM 15  --   --   --   --   --   --   --   --     Estimated Creatinine Clearance: 19.2 mL/min (A) (by C-G formula based on SCr of 3.87 mg/dL (H)).    No Known Allergies  Antimicrobials this admission: Vanc 6/15>> Meropenem 6/16>> Zosyn x 1 6/15 Azithro x 1 6/15   Dose adjustments this admission:   Microbiology results: 6/16 Resp cx: sent 6/16 Bld x2: sent  6/16 MRSA PCR sent   Vinnie LevelBenjamin Dechelle Attaway, PharmD., BCPS Clinical Pharmacist Clinical phone for 12/02/17 until 3:30pm: [phone] If after 3:30pm, please call main pharmacy at: [phone]

## 2017-12-02 NOTE — Progress Notes (Signed)
CRITICAL VALUE ALERT  Critical Value:  Lactic Acid 2.5  Date & Time Notied:  12/02/2017 0610  Provider Notified: Pola CornELINK  Orders Received/Actions taken: Maintenance fluid rate changed; [facility]

## 2017-12-02 NOTE — Progress Notes (Signed)
Pt weaned for 30 minutes on PS 5/ PEEP +5. Placed back on previous PRVC settings d/t low RR and low vts. RN aware.

## 2017-12-03 DIAGNOSIS — K7460 Unspecified cirrhosis of liver: Secondary | ICD-10-CM

## 2017-12-03 DIAGNOSIS — J9601 Acute respiratory failure with hypoxia: Secondary | ICD-10-CM

## 2017-12-03 LAB — GLUCOSE, CAPILLARY
GLUCOSE-CAPILLARY: 117 mg/dL — AB (ref 65–99)
GLUCOSE-CAPILLARY: 117 mg/dL — AB (ref 65–99)

## 2017-12-03 MED ORDER — FENTANYL BOLUS VIA INFUSION
50.0000 ug | INTRAVENOUS | Status: DC | PRN
  Filled 2017-12-03: qty 100

## 2017-12-03 MED ORDER — LORAZEPAM 2 MG/ML IJ SOLN
1.0000 mg | INTRAMUSCULAR | Status: DC | PRN
  Administered 2017-12-03: 4 mg via INTRAVENOUS
  Filled 2017-12-03: qty 2

## 2017-12-03 MED ORDER — GLYCOPYRROLATE 0.2 MG/ML IJ SOLN
0.2000 mg | INTRAMUSCULAR | Status: DC | PRN
  Administered 2017-12-03: 0.2 mg via INTRAVENOUS
  Filled 2017-12-03: qty 1

## 2017-12-04 ENCOUNTER — Telehealth: Payer: Self-pay

## 2017-12-04 LAB — CULTURE, BLOOD (ROUTINE X 2)
CULTURE: NO GROWTH
CULTURE: NO GROWTH
SPECIAL REQUESTS: ADEQUATE
SPECIAL REQUESTS: ADEQUATE

## 2017-12-04 NOTE — Telephone Encounter (Signed)
On 12/04/17 I received a [facility] (faxed). The d/c is for cremation.  The patient is a patient of Doctor McQuaid.   [facility] for signature.  On 12/04/17 I received the d/c back from Doctor McQuaid.  I got the d/c ready and faxed the d/c to the funeral home per the funeral home request.

## 2017-12-09 ENCOUNTER — Telehealth: Payer: Self-pay

## 2017-12-09 NOTE — Telephone Encounter (Signed)
On 12/09/17 I received a d/c from [facility] (original). The d/c is for cremation.  The patient is a patient of Doctor McQuaid.   The d/c will be taken to Pulmonary Unit for signature.  On 12/09/17 I received the d/c back from Doctor McQuaid.  I got the d/c ready and called the funeral home to let them know I mailed the d/c to vital records per the funeral home request.

## 2017-12-14 NOTE — Progress Notes (Signed)
Palliative care progress note  I checked in on Anna Miranda this AM.  Awake and alert on vent and denies pain or SOB.  Reviewed plan for extubation today.  I called and was able to reach daughter in law.  She reports that she and patient's son are coming to see her in the next couple of hours.  [name], MD [facility] Palliative Medicine Team [phone]

## 2017-12-14 NOTE — Progress Notes (Signed)
Palliative care progress note  I met today with patient, son, and daughter in law this AM.  We reviewed clinical course as well as plan for extubation.    Discussed plan for comfort following withdrawal including plan for medications for comfort.  Answered questions to the best of my ability.  Met with RN outside the room and discussed plan for symptom management following withdrawal, including continuous infusion as well as prn fentanyl, ativan, and robinul.  Total time: 40 minutes Greater than 50%  of this time was spent counseling and coordinating care related to the above assessment and plan.  Micheline Rough, MD [place] Team [phone]

## 2017-12-14 NOTE — Progress Notes (Signed)
PULMONARY / CRITICAL CARE MEDICINE   Name: Anna Miranda MRN: [id number] DOB: 03/19/1961    ADMISSION DATE:  11/29/2017 CONSULTATION DATE:  11/29/2017  REFERRING MD:  [facility]   CHIEF COMPLAINT:  Sepsis   HISTORY OF PRESENT ILLNESS:   57 year old female with PMH of End Stage Liver Disease, DM, Recurrent Right Side Pleural Effusion  Presents to [place] on 6/15 with reported 2 weeks of poor appetite and generalized weakness. Family reports that patient has been resistant to care and unwilling to go to the hospital. When she arrived to ED her BP was 60/40. Received Fluid, Vancomycin, Meropenem. CT Chest concerning for necrotic lung from prior infection. U/A with bacteria and Leukocytes. Multiple wounds that are foul smelling and maggots were noted. Required intubation for respiratory distress and hypoxia,however documented in discharge note from [place] that patient and family have previously expressed wishes for no intubation. On 6/16 presents to [facility] on Levophed.  Recent admission one month ago to [place], no much information available, documented however that she had a chest tube placed for recurrent pleural effusion.     Documented DNR, in which family states patient signed 2 years ago and would like to uphold.   SUBJECTIVE:  No acute change overnight.  Remains on pressors  Family coming today from [place].  She wants ETT out.     VITAL SIGNS: BP 108/67   Pulse 64   Temp (!) 97.2 F (36.2 C) (Axillary)   Resp 19   Ht 5\' 9"  (1.753 m)   Wt 90.1 kg (198 lb 10.2 oz)   SpO2 100%   BMI 29.33 kg/m   HEMODYNAMICS:    VENTILATOR SETTINGS: Vent Mode: PRVC FiO2 (%):  [40 %] 40 % Set Rate:  [20 bmp] 20 bmp Vt Set:  [400 mL] 400 mL PEEP:  [5 cmH20] 5 cmH20 Plateau Pressure:  [13 cmH20-23 cmH20] 23 cmH20  INTAKE / OUTPUT: I/O last 3 completed shifts: In: 6958.3 [I.V.:6258.1; IV Piggyback:700.2] Out: 3531 [Urine:2381; Emesis/NG output:1150]  PHYSICAL  EXAMINATION: General:  Chronically ill appearing female, NAD on vent  Neuro: awake, alert, follows commands, nods appropriately HEENT:  ETT, mm moist  Cardiovascular:  s1s2 rrr Lungs:  resps even non labored on vent, coarse Musculoskeletal: 1+ BLE edema Skin: multiple pressures ulcers as described below   LABS:  BMET Recent Labs  Lab 11/30/17 0448 12/01/17 0500 12/02/17 0425  NA 136 138 137  K 3.5 4.7 3.4*  CL 104 108 107  CO2 21* 16* 21*  BUN 90* 76* 68*  CREATININE 5.45* 4.44* 3.87*  GLUCOSE 185* 128* 155*    Electrolytes Recent Labs  Lab 11/30/17 0448 12/01/17 0500 12/02/17 0425  CALCIUM 7.9* 8.2* 8.3*  MG 2.1 1.9 1.7  PHOS 6.2* 5.9* 6.0*    CBC Recent Labs  Lab 11/30/17 0448 12/01/17 0844 12/02/17 0425  WBC 31.1* 24.8* 23.8*  HGB 9.7* 8.6* 9.5*  HCT 29.8* 27.5* 30.5*  PLT 198 111* 142*    Coag's Recent Labs  Lab 11/29/17 2030 12/02/17 0425  APTT 36  --   INR 2.14 1.64    Sepsis Markers Recent Labs  Lab 11/29/17 2030 11/30/17 0448  11/30/17 0848 11/30/17 1202 12/02/17 0425  LATICACIDVEN 3.5*  --    < > 2.3* 2.1* 2.5*  PROCALCITON 1.05 1.31  --   --   --  0.75   < > = values in this interval not displayed.    ABG Recent Labs  Lab 11/30/17 2005  12/01/17 0430 12/02/17 0520  PHART 7.360 7.343* 7.271*  PCO2ART 37.7 40.6 49.3*  PO2ART 139* 130* 150*    Liver Enzymes Recent Labs  Lab 11/29/17 2030 12/02/17 0425  AST 35 25  ALT 11* 11*  ALKPHOS 103 100  BILITOT 3.5* 3.7*  ALBUMIN 1.8* 2.4*    Cardiac Enzymes Recent Labs  Lab 11/30/17 0448 11/30/17 0830 11/30/17 1638  TROPONINI 0.06* 0.05* 0.04*    Glucose Recent Labs  Lab 12/02/17 1112 12/02/17 1524 12/02/17 1931 12/02/17 2319 09-04-2017 0311 09-04-2017 0734  GLUCAP 104* 120* 126* 126* 117* 117*    Imaging No results found.   STUDIES:  CT C/A/P 6/15 > Appearance of the right hemithorax which favors sequelae of prior infection, with chronic pleural fluid and  right lung base volume loss/rounded atelectasis. Areas of other right-sided opacification and anterior lung destruction. Fluid secretions in the Endobronchial tree, nonspecific left sided thyroid nodule  Renal u/s 6/16>>> neg acute  2D echo 6/17>>> EF 60-65%  MRI R hip 6/17>>>1. Marrow signal abnormalities of the sacral ala bilaterally compatible with sacral insufficiency or stress fractures. 2. Marrow signal abnormalities of the distal sacrum and coccyx with soft tissue ulceration raise concern for changes acute osteomyelitis. 3. Lumbar spondylosis. 4. Diffuse pelvic subcutaneous and intramuscular edema compatible cellulitis and/or myositis. Component of third spacing of fluid is also possibility given small to moderate free fluid in the pelvis. 5. Bilateral greater trochanteric bursitis. 6. Susceptibility artifacts about the right acetabulum. Foley and rectal tube are in place. 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    ASSESSMENT / PLAN:  PULMONARY A: Acute Hypoxic Respiratory Failure with  Recurrent Right Side Pleural Effusion P:   Vent support - 8cc/kg  Trend CXR  VAP bundle  Daily SBT  One-way extubation once family arrives 6/20  CARDIOVASCULAR A:  Septic Shock  H/O A.Fib  Able to wean levo with addition of vaso P:  Continue pressors for now, will d/c once family ready to withdraw   RENAL A:   Acute Kidney Injury  ??ESRD -- Per notes from [place] mention ESRD but no mention of HD, no  HD cath or AV fistula seen - unsure baseline Scr - Currently Crt 4.44  and trending down slowly  Metabolic acidosis / Lactic Acidosis  ?some element of hepato-renal  Hyperammonemia  P:   No further labs    GASTROINTESTINAL A:   End-Stage Liver Cirrhosis   Hyperammonemia  Worsening Jaundice  No output to lactulose>> ammonia level downtrending P:   NPO  DNR  Withdrawal of care today as above   HEMATOLOGIC A:   Concern for affected coagulation given Liver Cirrhosis  HGB drop 3 grams overnight ( Did get 1 L IVF bolus overnight for CVP of  5, + 4.6 L) Suspect hemodilutional P:  No further labs   INFECTIOUS A:   Septic Shock in setting of Osteo vs Urosepsis -- urine culture POS for EColi from OSH but u/a not largely impressive.  WBC with slight down trend.  Multiple wounds  Sacral Osteomyelitis  P:   D/c abx   ENDOCRINE A:   DM   P:   D/c SSI    NEUROLOGIC A:   Hepatic vs Metabolic Encephalopathy Increasing jaundice   P:   Continue fentanyl gtt  PRN ativan    FAMILY  - Updates: discussed at length with Dr. Neale Burly (palliative care) 6/20.  Family is en route from [place] with plans for withdrawal of care when they arrive and have had a bit of time to visit with patient who remains very awake.  Will then proceed with one-way extubation and d/c pressors per her wishes.  Will continue fentanyl gtt for pain/dyspnea.  Would like to allow her as much wakefulness as her respiratory status will tolerate to make the most of her remaining time to visit with family.    - Inter-disciplinary family meet or Palliative Care meeting due by:  12/06/2017>> Completed 11/30/2017   Dirk Dress, NP Dec 09, 2017  9:10 AM Pager: [phone] or [phone]

## 2017-12-14 NOTE — Death Summary Note (Signed)
DEATH SUMMARY   Patient Details  Name: Anna Miranda MRN: [id number] DOB: [birth year]  Admission/Discharge Information   Admit Date:  11/29/2017  Date of Death: Date of Death: (P) 09-13-17  Time of Death: Time of Death: (P) 1332  Length of Stay: 4  Referring Physician: No primary care provider on file.   Reason(s) for Hospitalization  End-stage liver disease  Diagnoses  Preliminary cause of death:   End-stage cirrhosis Severe sepsis, sacral osteomyelitis and multiple wounds Secondary Diagnoses (including complications and co-morbidities):  Active Problems:   Sepsis (HCC)   Cirrhosis (HCC)   DM (diabetes mellitus) (HCC)   Pressure injury of skin   Acute respiratory failure with hypoxemia [facility](HCC)   Brief Hospital Course (including significant findings, care, treatment, and services provided and events leading to death)  Anna Duffelancy Titsworth is a 57 y.o. year old female who was admitted to [facility] on November 29, 2017 in the setting of end-stage liver disease, worsening wounds which were foul-smelling and infected and respiratory failure.  The patient had spent a significant amount of time in the preceding weeks and outside hospitals dealing with wound infections, and necrotic lung infection in the right lung, and a pleural effusion and apparently had had a chest tube.  There is a known diagnosis of advanced cirrhosis.  The patient had explained to her family that she did not want aggressive level care and had been refusing hospitalization despite the family's concerns over clearly infected wounds which were noted to have maggots.  Eventually the patient's condition worsened and she was brought to an outside hospital where she was intubated for respiratory distress and then transferred to our facility.  After hospitalization here she received mechanical ventilatory support, antibiotics and vasopressors.  We were able to converse with the patient as she was quite awake and alert and she  indicated that she did not want this level of care she wanted us to withdraw care.  We consulted palliative medicine and had lengthy conversations with the patient's family who agreed that the best approach would be full comfort measures and withdrawal of care.  This was carried out with the family at the bedside on December 03, 2017 and she died peacefully with the family there.    Pertinent Labs and Studies  Significant Diagnostic Studies [place] Renal  Result Date: 11/29/2017 CLINICAL DATA:  Acute renal injury EXAM: RENAL / URINARY TRACT ULTRASOUND COMPLETE COMPARISON:  None. FINDINGS: Exam limited.  Immobile patient. Right Kidney: Length: 9.5 cm.  No hydronephrosis. Left Kidney: Length: 8.3 cm.  No hydronephrosis Bladder: decompressed IMPRESSION: No hydronephrosis. Electronically Signed   By: Genevive BiStewart  Edmunds M.D.   On: 11/29/2017 22:33   Mr Hip Right Wo Contrast  Result Date: 11/30/2017 CLINICAL DATA:  Multiple soft tissue wounds with concern for osteomyelitis of the right sacrum and ischium. EXAM: MR OF THE RIGHT HIP WITHOUT CONTRAST TECHNIQUE: Multiplanar, multisequence MR imaging was performed. No intravenous contrast was administered. COMPARISON:  KUB 11/30/2017 FINDINGS: Bones: Susceptibility artifacts emanating from the right acetabulum status post fixation. There is degenerative disc disease with disc flattening of the included lower lumbar spine at L4-5 and L5-S1. Marrow signal abnormality of the sacral ala bilaterally, compatible with evolving sacral insufficiency fractures. Raise concern for Marrow signal abnormality of the distal sacrum and coccyx, series 16/17 through 20 and 22 through 24 changes of acute osteomyelitis given soft tissue defects overlying this finding. Articular cartilage and labrum Articular cartilage: Joint space narrowing consistent with chondral thinning of both  hips. Labrum:  Suboptimally assessed due to lack of joint distention. Joint or bursal effusion Joint effusion:  No  joint effusion Bursae: Bilateral greater trochanteric bursitis. Muscles and tendons Muscles and tendons: Mild diffuse intramuscular edema compatible with myositis. No drainable fluid collections are noted. Other findings Miscellaneous: Subcutaneous soft tissue edema is noted of the included pelvis compatible with third spacing of fluid and/or cellulitis. No drainable fluid collections are noted. Foley catheter tube is in place with balloon deployed within the decompressed urinary bladder. There is a small to moderate amount of free fluid in the cul-de-sac. Rectal tube is in place. IMPRESSION: 1. Marrow signal abnormalities of the sacral ala bilaterally compatible with sacral insufficiency or stress fractures. 2. Marrow signal abnormalities of the distal sacrum and coccyx with soft tissue ulceration raise concern for changes acute osteomyelitis. 3. Lumbar spondylosis. 4. Diffuse pelvic subcutaneous and intramuscular edema compatible cellulitis and/or myositis. Component of third spacing of fluid is also possibility given small to moderate free fluid in the pelvis. 5. Bilateral greater trochanteric bursitis. 6. Susceptibility artifacts about the right acetabulum. Foley and rectal tube are in place. Electronically Signed   By: Tollie Eth M.D.   On: 11/30/2017 23:00   Dg Chest Port 1 View  Result Date: 12/02/2017 CLINICAL DATA:  Respiratory failure and shortness of breath. History of cirrhosis, diabetes. EXAM: PORTABLE CHEST 1 VIEW COMPARISON:  Portable chest x-ray of November 29, 2017 FINDINGS: The left lung is well-expanded. There is no focal infiltrate. A small amount of pleural fluid on the left may be present. On the right there is persistent volume loss. Pleural thickening versus loculated pleural fluid is noted in the apex and at the lung base. The cardiac silhouette is enlarged. The pulmonary vascularity is normal. The endotracheal tube tip projects 5.6 cm above the carina. The esophagogastric tube tip and  proximal port project below the GE junction. The left internal jugular venous catheter previously demonstrated is not clearly evident today. IMPRESSION: Fairly stable appearance of the chest. Volume loss on the right with increased pleuroparenchymal changes that may be postsurgical or post infectious. The support tubes are in reasonable position. Electronically Signed   By: David  [place]aziland M.D.   On: 12/02/2017 07:51   Dg Chest Port 1 View  Result Date: 11/29/2017 CLINICAL DATA:  Vent dependent; verify ETT and OG if possible EXAM: PORTABLE CHEST 1 VIEW COMPARISON:  None. FINDINGS: Endotracheal tube appears well positioned with tip approximately 4 cm above the carina. Enteric tube passes below the diaphragm. LEFT IJ central line appears adequately positioned with tip at the level of the upper SVC. Probable atelectasis and/or small pleural effusion at the RIGHT lung base. No pneumothorax seen. Heart size is upper normal. IMPRESSION: 1. Endotracheal tube appears well positioned with tip approximately 4 cm above the carina. 2. LEFT IJ central line in place with tip at the level of the upper SVC. 3. Enteric tube passes below the diaphragm. 4. Probable atelectasis and/or small pleural effusion at the RIGHT lung base. Additional chronic/postsurgical changes at the RIGHT lung base? Electronically Signed   By: Bary Richard M.D.   On: 11/29/2017 19:58   Dg Ankle Right Port  Result Date: 11/29/2017 CLINICAL DATA:  Evaluate osteomyelitis RIGHT ankle. Deep tissue injury RIGHT os calcis EXAM: PORTABLE RIGHT ANKLE - 2 VIEW COMPARISON:  None. FINDINGS: There is no cortical disruption or obvious destructive change to suggest active osteomyelitis within the osseous structures about the RIGHT ankle. However, patchy osteopenia limits characterization and  osteomyelitis cannot be confidently excluded. Severe distortion and fragmentation within the hindfoot and midfoot suggest neuropathic joint. IMPRESSION: 1. No definite evidence  of osteomyelitis involving the osseous structures about the RIGHT ankle. 2. However, patchy osteopenia limits characterization making it difficult to confidently exclude osteomyelitis. If clinical suspicion for osteomyelitis persists, would consider MRI for more definitive characterization. 3. Distortion/fragmentation within the midfoot and hindfoot suggest neuropathic joint. Electronically Signed   By: Bary Richard M.D.   On: 11/29/2017 21:28   Dg Abd Portable 1v  Result Date: 11/30/2017 CLINICAL DATA:  Abdominal distention EXAM: PORTABLE ABDOMEN - 1 VIEW COMPARISON:  None. FINDINGS: Nonobstructive bowel gas pattern. No free air or organomegaly. No suspicious calcification. IVC filter is in place. Postoperative changes in the right bony pelvis. IMPRESSION: No acute findings. Electronically Signed   By: Charlett Nose M.D.   On: 11/30/2017 10:37   Dg Foot 2 Views Right  Result Date: 11/29/2017 CLINICAL DATA:  Evaluate for osteomyelitis RIGHT great toe, wound on tip of distal phalanx of great toe. Also deep tissue injury to calcaneus of RIGHT foot. EXAM: RIGHT FOOT - 2 VIEW COMPARISON:  None. FINDINGS: Patchy osteopenia limits characterization for osteomyelitis. There is no gross destruction or cortical disruption to confirm an active osteomyelitis. Specifically, no destruction or deformity of the distal phalanx of the great toe. Severe degenerative change and/or fragmentation within the midfoot suggest neuropathic joint. Large dystrophic calcifications are seen along the dorsal and plantar margins of the posterior calcaneus. No soft tissue gas identified. Irregularity of the soft tissues overlying the tuft of the distal phalanx of the great toe is compatible with the given history of soft tissue wound. IMPRESSION: 1. No definite evidence of osteomyelitis, with limitations detailed above. Specifically, no destructive change or cortical disruption at the distal phalanx of the great toe corresponding to the area  of most clinical concern. 2. Irregularity of the soft tissues overlying the tuft of the distal phalanx of the great toe, compatible with the given history of soft tissue wound. No soft tissue gas seen. 3. Marked deformity/fragmentation within the midfoot suggest neuropathic joint. 4. Large dystrophic calcifications along the plantar and dorsal margins of the posterior calcaneus, suggesting chronic severe plantar fasciitis and/or Achilles tendinosis. Electronically Signed   By: Bary Richard M.D.   On: 11/29/2017 21:32    Microbiology Recent Results (from the past 240 hour(s))  MRSA PCR Screening     Status: Abnormal   Collection Time: 11/29/17  7:41 PM  Result Value Ref Range Status   MRSA by PCR POSITIVE (A) NEGATIVE Final    Comment:        The GeneXpert MRSA Assay (FDA approved for NASAL specimens only), is one component of a comprehensive MRSA colonization surveillance program. It is not intended to diagnose MRSA infection nor to guide or monitor treatment for MRSA infections. RESULT CALLED TO, READ BACK BY AND VERIFIED WITH: [facility] RN 11/29/17 AT 2215 SKEEN,P Performed at [facility], [address] [place], [place] [address]   Culture, blood (routine x 2)     Status: None (Preliminary result)   Collection Time: 11/29/17 10:05 PM  Result Value Ref Range Status   Specimen Description BLOOD RIGHT ANTECUBITAL  Final   Special Requests   Final    BOTTLES DRAWN AEROBIC AND ANAEROBIC Blood Culture adequate volume   Culture   Final    NO GROWTH 4 DAYS Performed at [facility], [address] [place], [place] [address]  Report Status PENDING  Incomplete  Culture, blood (routine x 2)     Status: None (Preliminary result)   Collection Time: 11/29/17 10:12 PM  Result Value Ref Range Status   Specimen Description BLOOD RIGHT HAND  Final   Special Requests   Final    BOTTLES DRAWN AEROBIC AND ANAEROBIC Blood Culture adequate volume   Culture   Final    NO GROWTH  4 DAYS Performed at [facility], [address] [place], [place] [address]    Report Status PENDING  Incomplete  Culture, Urine     Status: None   Collection Time: 11/29/17 11:24 PM  Result Value Ref Range Status   Specimen Description URINE, RANDOM  Final   Special Requests NONE  Final   Culture   Final    NO GROWTH Performed at [facility], [address], [place], [place] [address]    Report Status 12/01/2017 FINAL  Final  Culture, respiratory (NON-Expectorated)     Status: None   Collection Time: 11/30/17 12:01 PM  Result Value Ref Range Status   Specimen Description TRACHEAL ASPIRATE  Final   Special Requests NONE  Final   Gram Stain   Final    MODERATE WBC PRESENT,BOTH PMN AND MONONUCLEAR RARE SQUAMOUS EPITHELIAL CELLS PRESENT NO ORGANISMS SEEN    Culture   Final    RARE Consistent with normal respiratory flora. Performed at [facility], [address], [place], [place] [address]    Report Status 12/02/2017 FINAL  Final    Lab Basic Metabolic Panel: Recent Labs  Lab 11/29/17 2030 11/30/17 0448 12/01/17 0500 12/02/17 0425  NA 136 136 138 137  K 3.7 3.5 4.7 3.4*  CL 101 104 108 107  CO2 20* 21* 16* 21*  GLUCOSE 146* 185* 128* 155*  BUN 96* 90* 76* 68*  CREATININE 5.79* 5.45* 4.44* 3.87*  CALCIUM 8.1* 7.9* 8.2* 8.3*  MG 1.7 2.1 1.9 1.7  PHOS 6.8* 6.2* 5.9* 6.0*   Liver Function Tests: Recent Labs  Lab 11/29/17 2030 12/02/17 0425  AST 35 25  ALT 11* 11*  ALKPHOS 103 100  BILITOT 3.5* 3.7*  PROT 6.5 5.9*  ALBUMIN 1.8* 2.4*   No results for input(s): LIPASE, AMYLASE in the last 168 hours. Recent Labs  Lab 11/29/17 2030 11/30/17 0448 12/01/17 0844  AMMONIA 73* 61* 37*   CBC: Recent Labs  Lab 11/29/17 2030 11/30/17 0448 12/01/17 0844 12/02/17 0425  WBC 25.6* 31.1* 24.8* 23.8*  HGB 9.9* 9.7* 8.6* 9.5*  HCT 30.5* 29.8* 27.5* 30.5*  MCV 100.3* 102.4* 105.0* 103.7*  PLT 221 198 111* 142*   Cardiac Enzymes: Recent Labs   Lab 11/29/17 2030 11/30/17 0448 11/30/17 0830 11/30/17 1638  CKTOTAL 110  --   --   --   TROPONINI 0.11* 0.06* 0.05* 0.04*   Sepsis Labs: Recent Labs  Lab 11/29/17 2030 11/30/17 0448 11/30/17 0504 11/30/17 0848 11/30/17 1202 12/01/17 0844 12/02/17 0425  PROCALCITON 1.05 1.31  --   --   --   --  0.75  WBC 25.6* 31.1*  --   --   --  24.8* 23.8*  LATICACIDVEN 3.5*  --  2.5* 2.3* 2.1*  --  2.5*    Procedures/Operations  none   Max Fickle 12-28-17, 2:40 PM

## 2017-12-14 NOTE — Progress Notes (Signed)
200 ml Fentanyl wasted in sink with Lynetta MareHeather Bowman, RN.

## 2017-12-14 NOTE — Progress Notes (Signed)
Pt extubated at 1157 per patient's request. Pallatative spoke with family and patient about 20 minutes before extubated about what to expect and plan of care.  Both patient and family agreed that extubation was the patient's wish. Pt was made comfortable and family remained at bedside after extubation.

## 2017-12-14 NOTE — Progress Notes (Signed)
[facility]. Medial examiner present at bedside.

## 2017-12-14 NOTE — Progress Notes (Signed)
[facility]  Comfort care only at this point, extubated today. Resting comfortably with family at bedside  Continue current management  Heber CarolinaBrent McQuaid, MD  PCCM Pager: [phone] Cell: [phone] After 3pm or if no response, call [phone]

## 2017-12-14 NOTE — Progress Notes (Signed)
Chaplain responded to consult- in talking to staff, pt family would be coming down and was still to arrive.  Cleared [place] consult and asked staff to please page chaplain on family arrival.

## 2017-12-14 NOTE — Progress Notes (Signed)
Pt extubated per withdrawal order. Family and RN at bedside. 

## 2017-12-14 NOTE — Progress Notes (Addendum)
1:12pm- CSW checked back in to offer support to pt and son at this time. Son expressed that son is not in need of anything at this time. CSW offered emotional support to son. At this there are no further CSW needs. CSW will sign off. Please reconsult if new need arises.   CSW spoke with RN on unit regarding pt. CSW was informed that chaplain services was bale to get back in touch with family and family would be at the hospital any moment now. CSW informed that this will likely be a hospital death CSW to check back to offer support to family once they arrive.  Claude MangesKierra S. Huy Majid, MSW, LCSW-A Emergency Department Clinical Social Worker [phone]

## 2017-12-14 DEATH — deceased

## 2019-02-24 IMAGING — DX DG CHEST 1V PORT
1 series · 1 of 1 positions shown · non-contrast
Comparison: Portable chest x-ray November 29, 2017

CLINICAL DATA: Respiratory failure and shortness of breath. History
of cirrhosis, diabetes.

EXAM:
PORTABLE CHEST 1 VIEW

[chest ap]
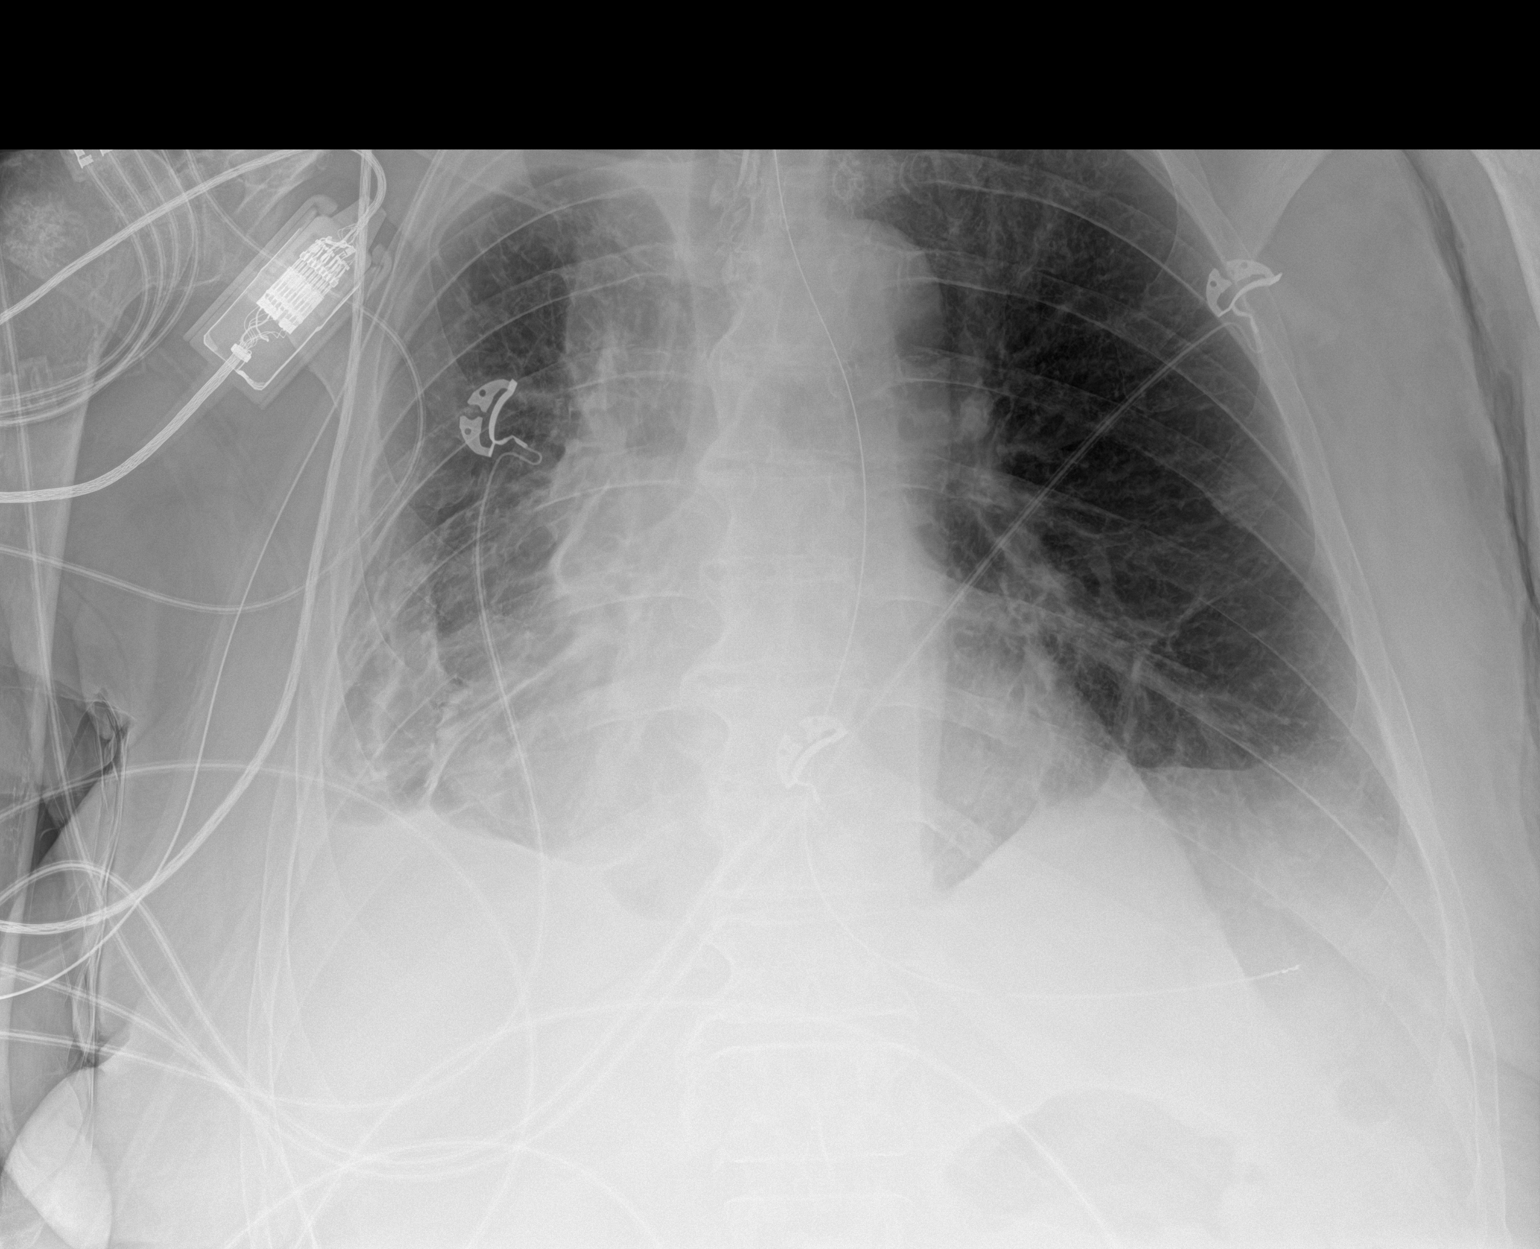

[1 of 1 positions shown; findings below may reference images not displayed]

FINDINGS: The left lung is well-expanded. There is no focal infiltrate. A
small amount of pleural fluid on the left may be present. On the
right there is persistent volume loss. Pleural thickening versus
loculated pleural fluid is noted in the apex and at the lung base.
The cardiac silhouette is enlarged. The pulmonary vascularity is
normal. The endotracheal tube tip projects 5.6 cm above the carina.
The esophagogastric tube tip and proximal port project below the GE
junction. The left internal jugular venous catheter previously
demonstrated is not clearly evident today.
IMPRESSION: Fairly stable appearance of the chest. Volume loss on the right with
increased pleuroparenchymal changes that may be postsurgical or post
infectious.

The support tubes are in reasonable position.
